# Patient Record
Sex: Male | Born: 1955 | Race: White | Hispanic: No | Smoking: Never smoker
Health system: Southern US, Community
[De-identification: ages and names within clinical notes are randomized; demographics above are authoritative.]

## PROBLEM LIST (undated history)

## (undated) DIAGNOSIS — F411 Generalized anxiety disorder: Secondary | ICD-10-CM

## (undated) DIAGNOSIS — G4733 Obstructive sleep apnea (adult) (pediatric): Secondary | ICD-10-CM

## (undated) DIAGNOSIS — E785 Hyperlipidemia, unspecified: Secondary | ICD-10-CM

## (undated) DIAGNOSIS — Z86010 Personal history of colonic polyps: Secondary | ICD-10-CM

## (undated) DIAGNOSIS — T884XXA Failed or difficult intubation, initial encounter: Secondary | ICD-10-CM

## (undated) DIAGNOSIS — N453 Epididymo-orchitis: Secondary | ICD-10-CM

## (undated) DIAGNOSIS — N508 Other specified disorders of male genital organs: Secondary | ICD-10-CM

## (undated) DIAGNOSIS — M25519 Pain in unspecified shoulder: Secondary | ICD-10-CM

## (undated) DIAGNOSIS — C801 Malignant (primary) neoplasm, unspecified: Secondary | ICD-10-CM

## (undated) DIAGNOSIS — K219 Gastro-esophageal reflux disease without esophagitis: Secondary | ICD-10-CM

## (undated) DIAGNOSIS — J309 Allergic rhinitis, unspecified: Secondary | ICD-10-CM

## (undated) DIAGNOSIS — R569 Unspecified convulsions: Secondary | ICD-10-CM

## (undated) DIAGNOSIS — G4730 Sleep apnea, unspecified: Secondary | ICD-10-CM

## (undated) HISTORY — DX: Other specified disorders of male genital organs: N50.8

## (undated) HISTORY — DX: Epididymo-orchitis: N45.3

## (undated) HISTORY — DX: Sleep apnea, unspecified: G47.30

## (undated) HISTORY — DX: Obstructive sleep apnea (adult) (pediatric): G47.33

## (undated) HISTORY — DX: Unspecified convulsions: R56.9

## (undated) HISTORY — DX: Generalized anxiety disorder: F41.1

## (undated) HISTORY — DX: Pain in unspecified shoulder: M25.519

## (undated) HISTORY — PX: TONSILLECTOMY: SUR1361

## (undated) HISTORY — DX: Allergic rhinitis, unspecified: J30.9

## (undated) HISTORY — DX: Personal history of colonic polyps: Z86.010

## (undated) HISTORY — PX: WISDOM TOOTH EXTRACTION: SHX21

## (undated) HISTORY — DX: Malignant (primary) neoplasm, unspecified: C80.1

## (undated) HISTORY — DX: Gastro-esophageal reflux disease without esophagitis: K21.9

## (undated) HISTORY — DX: Hyperlipidemia, unspecified: E78.5

## (undated) HISTORY — PX: OTHER SURGICAL HISTORY: SHX169

---
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
## 1977-01-14 HISTORY — PX: RENAL BIOPSY: SHX156

## 2000-01-15 DIAGNOSIS — G4730 Sleep apnea, unspecified: Secondary | ICD-10-CM

## 2000-01-15 HISTORY — DX: Sleep apnea, unspecified: G47.30

## 2004-06-12 ENCOUNTER — Ambulatory Visit: Payer: Self-pay | Admitting: Internal Medicine

## 2004-10-23 ENCOUNTER — Ambulatory Visit: Payer: Self-pay | Admitting: Internal Medicine

## 2005-01-24 ENCOUNTER — Ambulatory Visit: Payer: Self-pay | Admitting: Internal Medicine

## 2005-09-13 ENCOUNTER — Ambulatory Visit: Payer: Self-pay | Admitting: Internal Medicine

## 2006-01-14 HISTORY — PX: ARTHROSCOPIC REPAIR ACL: SUR80

## 2006-04-03 ENCOUNTER — Ambulatory Visit: Payer: Self-pay | Admitting: Internal Medicine

## 2006-09-13 ENCOUNTER — Encounter: Payer: Self-pay | Admitting: Internal Medicine

## 2006-09-13 DIAGNOSIS — G4733 Obstructive sleep apnea (adult) (pediatric): Secondary | ICD-10-CM

## 2006-09-13 DIAGNOSIS — E785 Hyperlipidemia, unspecified: Secondary | ICD-10-CM

## 2006-09-13 HISTORY — DX: Hyperlipidemia, unspecified: E78.5

## 2006-09-13 HISTORY — DX: Obstructive sleep apnea (adult) (pediatric): G47.33

## 2006-09-15 DIAGNOSIS — F411 Generalized anxiety disorder: Secondary | ICD-10-CM

## 2006-09-15 DIAGNOSIS — K219 Gastro-esophageal reflux disease without esophagitis: Secondary | ICD-10-CM | POA: Insufficient documentation

## 2006-09-15 HISTORY — DX: Generalized anxiety disorder: F41.1

## 2006-09-15 HISTORY — DX: Gastro-esophageal reflux disease without esophagitis: K21.9

## 2006-11-03 ENCOUNTER — Encounter: Payer: Self-pay | Admitting: Internal Medicine

## 2006-11-03 ENCOUNTER — Ambulatory Visit: Payer: Self-pay | Admitting: Internal Medicine

## 2006-11-03 DIAGNOSIS — J309 Allergic rhinitis, unspecified: Secondary | ICD-10-CM

## 2006-11-03 DIAGNOSIS — M25519 Pain in unspecified shoulder: Secondary | ICD-10-CM | POA: Insufficient documentation

## 2006-11-03 HISTORY — DX: Pain in unspecified shoulder: M25.519

## 2006-11-03 HISTORY — DX: Allergic rhinitis, unspecified: J30.9

## 2008-06-26 ENCOUNTER — Emergency Department (HOSPITAL_COMMUNITY): Admission: EM | Admit: 2008-06-26 | Discharge: 2008-06-26 | Payer: Self-pay | Admitting: Emergency Medicine

## 2009-01-14 HISTORY — PX: WISDOM TOOTH EXTRACTION: SHX21

## 2009-11-03 ENCOUNTER — Ambulatory Visit: Payer: Self-pay | Admitting: Internal Medicine

## 2009-11-03 DIAGNOSIS — N453 Epididymo-orchitis: Secondary | ICD-10-CM

## 2009-11-03 DIAGNOSIS — N508 Other specified disorders of male genital organs: Secondary | ICD-10-CM | POA: Insufficient documentation

## 2009-11-03 HISTORY — DX: Other specified disorders of male genital organs: N50.8

## 2009-11-03 HISTORY — DX: Epididymo-orchitis: N45.3

## 2009-11-03 LAB — CONVERTED CEMR LAB
Leukocytes, UA: NEGATIVE
Nitrite: NEGATIVE
Specific Gravity, Urine: 1.025 (ref 1.000–1.030)
Urine Glucose: NEGATIVE mg/dL
Urobilinogen, UA: 0.2 (ref 0.0–1.0)

## 2009-11-04 ENCOUNTER — Encounter: Payer: Self-pay | Admitting: Internal Medicine

## 2009-11-10 ENCOUNTER — Encounter: Admission: RE | Admit: 2009-11-10 | Discharge: 2009-11-10 | Payer: Self-pay | Admitting: Internal Medicine

## 2010-02-15 NOTE — Assessment & Plan Note (Signed)
Summary: DULL TESTICLE PAIN X 1 MTH--LAST OV:  08--STC   Vital Signs:  Patient profile:   55 year old male Height:      73 inches Weight:      196.50 pounds BMI:     26.02 O2 Sat:      96 % on Room air Temp:     98.6 degrees F oral Pulse rate:   87 / minute BP sitting:   100 / 70  (left arm) Cuff size:   regular  Vitals Entered By: Zella Ball Ewing CMA Duncan Dull) (November 03, 2009 2:45 PM)  O2 Flow:  Room air CC: Dull ache on testicle for 1 month/RE   CC:  Dull ache on testicle for 1 month/RE.  History of Present Illness: here with acute, currently unemployed since march, left a difficult medical sales position , took the summer off of looking for work to [employer] after a divorce finalized as well  - so signifciant stressors ongoing for last 2 yrs; just returned from [place] trip with girlfriend; while there mentions a day where they simply did not eat all day and after 14 hrs sightseeing they were in a food store when he had an episode of feelling "dazed" where he was cognizant and could speak , hear but quite slow but not really confused;  all resolved within 2-3 min of getting something sugary to eat; similar episode happened a few yrs ago after going all day without eating as well.  Pt denies CP, worsening sob, doe, wheezing, orthopnea, pnd, worsening LE edema, palps, dizziness or syncope  Pt denies other new neuro symptoms such as headache, facial or extremity weakness . No siezure type symtpoms as well.  Denies polydipsia or polyuria.  No fever, wt loss, night sweats, loss of appetite or other constitutional symptoms  Does have tender achiness to right testicle area for 1 wk, without othereGU symptoms such as pain on urination, freq, urgency.  Also noted hard lump behind the left testicle as well , not sure how long it has been there, but wondered about the signifcance.    Problems Prior to Update: 1)  Testicular Mass  (ICD-608.89) 2)  Epididymo-orchitis  (ICD-604.90) 3)  Preventive Health  Care  (ICD-V70.0) 4)  Shoulder Pain, Right  (ICD-719.41) 5)  Allergic Rhinitis  (ICD-477.9) 6)  Family History of Cad Male 1st Degree Relative <60  (ICD-V16.49) 7)  Gerd  (ICD-530.81) 8)  Anxiety  (ICD-300.00) 9)  Obstructive Sleep Apnea  (ICD-327.23) 10)  Hyperlipidemia  (ICD-272.4)  Medications Prior to Update: 1)  Prilosec Otc 20 Mg  Tbec (Omeprazole Magnesium) .Marland Kitchen.. 1 By Mouth Qd  Current Medications (verified): 1)  Prilosec Otc 20 Mg  Tbec (Omeprazole Magnesium) .Marland Kitchen.. 1 By Mouth Qd 2)  Aspir-Low 81 Mg Tbec (Aspirin) .Marland Kitchen.. 1 By Mouth Once Daily 3)  Doxycycline Hyclate 100 Mg Caps (Doxycycline Hyclate) .Marland Kitchen.. 1po Two Times A Day  Allergies (verified): No Known Drug Allergies  Past History:  Past Medical History: Last updated: 11/03/2006 Hyperlipidemia Obstructive Sleep Apnea Anxiety GERD Allergic rhinitis  Past Surgical History: Last updated: 09/13/2006 Inguinal herniorrhaphy  Social History: Last updated: 11/03/2009 Never Smoked Alcohol use-yes Divorced  Risk Factors: Smoking Status: never (11/03/2006)  Social History: Never Smoked Alcohol use-yes Divorced  Review of Systems       all otherwise negative per pt -    Physical Exam  General:  alert and well-developed.   Head:  normocephalic and atraumatic.   Eyes:  vision grossly intact, pupils  equal, and pupils round.   Ears:  R ear normal and L ear normal.   Nose:  no external deformity and no nasal discharge.   Mouth:  no gingival abnormalities and pharynx pink and moist.   Neck:  supple and no masses.   Lungs:  normal respiratory effort and normal breath sounds.   Heart:  normal rate and regular rhythm.   Genitalia:  mild tender/sweling noted to the right testicle/epididymous;  also noted post to the left testicle approx 10 mm hard mass, mobile, nontender Extremities:  no edema, no erythema    Impression & Recommendations:  Problem # 1:  EPIDIDYMO-ORCHITIS (ICD-604.90) treat as above, f/u any  worsening signs or symptoms . check urine cx Orders: T-Culture, Urine ([id number]) TLB-Udip w/ Micro ([id number])  Problem # 2:  TESTICULAR MASS (ICD-608.89) ? significance - to check testicle u/s, but by location I suspect possible cystic mass Orders: Radiology Referral (Radiology)  Problem # 3:  ANXIETY (ICD-300.00) stable overall by hx and exam, ok to continue meds/tx as is - situational, no further tx needed at this time  Complete Medication List: 1)  Prilosec Otc 20 Mg Tbec (Omeprazole magnesium) .Marland Kitchen.. 1 by mouth qd 2)  Aspir-low 81 Mg Tbec (Aspirin) .Marland Kitchen.. 1 by mouth once daily 3)  Doxycycline Hyclate 100 Mg Caps (Doxycycline hyclate) .Marland Kitchen.. 1po two times a day  Patient Instructions: 1)  Please take all new medications as prescribed  - the antibiotic 2)  Please go to the Lab in the basement for your urine tests today  3)  You will be contacted about the referral(s) to: ultrasound of testicles 4)  Continue all previous medications as before this visit  5)  Please schedule a follow-up appointment as needed. Prescriptions: DOXYCYCLINE HYCLATE 100 MG CAPS (DOXYCYCLINE HYCLATE) 1po two times a day  #20 x 0   Entered and Authorized by:   Corwin Levins MD   Signed by:   Corwin Levins MD on 11/03/2009   Method used:   Print then Give to Patient   RxID:   [id number]    Orders Added: 1)  T-Culture, Urine [[id number]] 2)  Radiology Referral [Radiology] 3)  TLB-Udip w/ Micro [81001-URINE] 4)  Est. Patient Level IV [84696]

## 2010-05-10 ENCOUNTER — Ambulatory Visit (INDEPENDENT_AMBULATORY_CARE_PROVIDER_SITE_OTHER)
Admission: RE | Admit: 2010-05-10 | Discharge: 2010-05-10 | Disposition: A | Discharge status: Home / Self Care | Payer: BC Managed Care – PPO | Source: Ambulatory Visit | Attending: Internal Medicine | Admitting: Internal Medicine

## 2010-05-10 ENCOUNTER — Ambulatory Visit (INDEPENDENT_AMBULATORY_CARE_PROVIDER_SITE_OTHER): Payer: BC Managed Care – PPO | Admitting: Internal Medicine

## 2010-05-10 ENCOUNTER — Encounter: Payer: Self-pay | Admitting: Internal Medicine

## 2010-05-10 VITALS — BP 104/72 | HR 82 | Temp 98.6°F | Ht 73.0 in | Wt 197.1 lb

## 2010-05-10 DIAGNOSIS — R053 Chronic cough: Secondary | ICD-10-CM

## 2010-05-10 DIAGNOSIS — J309 Allergic rhinitis, unspecified: Secondary | ICD-10-CM

## 2010-05-10 DIAGNOSIS — Z0000 Encounter for general adult medical examination without abnormal findings: Secondary | ICD-10-CM

## 2010-05-10 DIAGNOSIS — R05 Cough: Secondary | ICD-10-CM

## 2010-05-10 DIAGNOSIS — F411 Generalized anxiety disorder: Secondary | ICD-10-CM

## 2010-05-10 MED ORDER — HYDROCODONE-HOMATROPINE 5-1.5 MG/5ML PO SYRP: 5.0000 mL | ORAL_SOLUTION | Freq: Four times a day (QID) | ORAL | Status: AC | PRN

## 2010-05-10 NOTE — Assessment & Plan Note (Addendum)
I suspect simple post bronchitic persistent that will eventually resolve, but with CP and fatigue will check cxr, and cough med prn ,  to f/u any worsening symptoms or concerns

## 2010-05-10 NOTE — Patient Instructions (Signed)
Take all new medications as prescribed Continue all other medications as before Please go to XRAY in the Basement for the x-ray test Please call the number on the [facility] Card (the [employer] System) for results of testing in 2-3 days

## 2010-05-11 NOTE — Progress Notes (Signed)
Quick Note:  Voice message left on [employer] system - lab is negative, normal or otherwise stable, pt to continue same tx ______ 

## 2010-05-12 ENCOUNTER — Encounter: Payer: Self-pay | Admitting: Internal Medicine

## 2010-05-12 DIAGNOSIS — Z0000 Encounter for general adult medical examination without abnormal findings: Secondary | ICD-10-CM | POA: Insufficient documentation

## 2010-05-12 DIAGNOSIS — Z0001 Encounter for general adult medical examination with abnormal findings: Secondary | ICD-10-CM | POA: Insufficient documentation

## 2010-05-12 NOTE — Assessment & Plan Note (Signed)
stable overall by hx and exam, and pt to continue medical treatment as before  to f/u any worsening symptoms or concerns

## 2010-05-12 NOTE — Assessment & Plan Note (Signed)
Mild, for OTC allegra prn,  to f/u any worsening symptoms or concerns

## 2010-05-12 NOTE — Progress Notes (Signed)
Subjective:    Patient ID: Bradley Roberts, male    DOB: 05-Sep-1955, 55 y.o.   MRN: [id number]  HPI  Here to f/u, after initally being seen at urgent care approx 3.5 wks ago;  S/p 5 day antibx (cant remember name) for URI, later returned with wheezing and cough tx with steroid shot, and prepack (finished apr 23);  Here today with persistent cough, nonprod, some "rattling" on expirations and mid low SSCP sharp only to turn over in bed at night;  Pt denies other chest pain, increased sob or doe, wheezing, orthopnea, PND, increased LE swelling, palpitations, dizziness or syncope.No CXR done since start of illness.  Delsym not working for cough.  Not prescribed inhaler and did not feel he needed it.  Nonsmoker.   Pt denies fever, wt loss, night sweats, loss of appetite, or other constitutional symptoms, except with current illness and no fever, chills today.  Has significant persistent fatigue and takes nap during the day.   Pt denies new neurological symptoms such as new headache, or facial or extremity weakness or numbness   Pt denies polydipsia, polyuria.  Denies worsening depressive symptoms, suicidal ideation, or panic.   Does have several months ongoing nasal allergy symptoms with clear congestion, itch and sneeze, without fever, pain, ST.  Past Medical History  Diagnosis Date  . HYPERLIPIDEMIA 09/13/2006  . ANXIETY 09/15/2006  . OBSTRUCTIVE SLEEP APNEA 09/13/2006  . ALLERGIC RHINITIS 11/03/2006  . GERD 09/15/2006  . 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 TESTICULAR MASS 11/03/2009  . SHOULDER PAIN, RIGHT 11/03/2006   Past Surgical History  Procedure Date  . Inguinal herniorrhapy     reports that he has never smoked. He does not have any smokeless tobacco history on file. He reports that he drinks alcohol. His drug history not on file. family history includes Cancer in his other and Coronary artery disease (age of onset:60) in his mother. No Known Allergies Current Outpatient Prescriptions on File Prior  to Visit  Medication Sig Dispense Refill  . aspirin 81 MG EC tablet Take 81 mg by mouth daily.        Marland Kitchen omeprazole (PRILOSEC) 20 MG capsule Take 20 mg by mouth daily.        Marland Kitchen doxycycline (VIBRAMYCIN) 100 MG capsule Take 100 mg by mouth 2 (two) times daily.         Review of Systems Review of Systems  Constitutional: Negative for diaphoresis and unexpected weight change.  HENT: Negative for drooling and tinnitus.   Eyes: Negative for photophobia and visual disturbance.  Respiratory: Negative for choking and stridor.   Gastrointestinal: Negative for vomiting and blood in stool.  Genitourinary: Negative for hematuria and decreased urine volume.  Musculoskeletal: Negative for gait problem.  Skin: Negative for color change and wound.  Neurological: Negative for tremors and numbness.  Psychiatric/Behavioral: Negative for decreased concentration. The patient is not hyperactive.       Objective:   Physical Exam BP 104/72  Pulse 82  Temp(Src) 98.6 F (37 C) (Oral)  Ht 6\' 1"  (1.854 m)  Wt 197 lb 2 oz (89.415 kg)  BMI 26.01 kg/m2  SpO2 96% Physical Exam  VS noted Constitutional: Pt appears well-developed and well-nourished.  HENT: Head: Normocephalic.  Right Ear: External ear normal.  Left Ear: External ear normal.  Bilat tm's mild erythema.  Sinus nontender.  Pharynx mild erythema Eyes: Conjunctivae and EOM are normal. Pupils are equal, round, and reactive to light.  Neck: Normal range of motion. Neck supple.  Cardiovascular: Normal rate and regular rhythm.   Pulmonary/Chest: Effort normal and breath sounds normal.  Neurological: Pt is alert. No cranial nerve deficit.  Skin: Skin is warm. No erythema.  Psychiatric: Pt behavior is normal. Thought content normal.  1+ nervous        Assessment & Plan:

## 2012-05-12 ENCOUNTER — Ambulatory Visit (INDEPENDENT_AMBULATORY_CARE_PROVIDER_SITE_OTHER): Payer: PRIVATE HEALTH INSURANCE | Admitting: Internal Medicine

## 2012-05-12 ENCOUNTER — Encounter: Payer: Self-pay | Admitting: Internal Medicine

## 2012-05-12 VITALS — BP 114/80 | HR 78 | Temp 98.3°F | Ht 73.0 in | Wt 187.1 lb

## 2012-05-12 DIAGNOSIS — K219 Gastro-esophageal reflux disease without esophagitis: Secondary | ICD-10-CM

## 2012-05-12 DIAGNOSIS — H029 Unspecified disorder of eyelid: Secondary | ICD-10-CM | POA: Insufficient documentation

## 2012-05-12 DIAGNOSIS — K4090 Unilateral inguinal hernia, without obstruction or gangrene, not specified as recurrent: Secondary | ICD-10-CM

## 2012-05-12 NOTE — Assessment & Plan Note (Signed)
Reducible, for gen surgury referral

## 2012-05-12 NOTE — Assessment & Plan Note (Signed)
Benign appaerance, pt will call with name of opthomologist he would want referral

## 2012-05-12 NOTE — Patient Instructions (Addendum)
Please continue all other medications as before - the prilosec, and aspirin You will be contacted regarding the referral for: general surgury Please forward any recent lab work from your most recent insurance evaluation Please call or message with the name of opthomology Thank you for enrolling in MyChart. Please follow the instructions below to securely access your online medical record. MyChart allows you to send messages to your doctor, view your test results, renew your prescriptions, schedule appointments, and more. To Log into My Chart online, please go by [employer] or [employer] to [email or web address], or download the MyChart App from the [employer] of [employer] .  Your Username is: [email or web address] (pass natedad!) Please send a practice Message on Mychart later today.

## 2012-05-12 NOTE — Progress Notes (Signed)
  Subjective:    Patient ID: Bradley Roberts, male    DOB: 1955/08/03, 57 y.o.   MRN: [id number]  HPI  Here to f/u, employed as medical device salesman/optho related, c/o 4-5 yr lesion to left upper eyelid ? Increased in size, tan/not black, no pain or other visual symptoms.  Also has been more excercising lately, lost 10 lbs and now with seemingly increased Right inguinal hernia like swelling without increased pain, n/v or bowel change.   Pt denies fever, wt loss, night sweats, loss of appetite, or other constitutional symptoms. Denies worsening reflux, abd pain, dysphagia, n/v, bowel change or blood, prefers OTC med. Past Medical History  Diagnosis Date  . HYPERLIPIDEMIA 09/13/2006  . ANXIETY 09/15/2006  . OBSTRUCTIVE SLEEP APNEA 09/13/2006  . ALLERGIC RHINITIS 11/03/2006  . GERD 09/15/2006  . 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 TESTICULAR MASS 11/03/2009  . SHOULDER PAIN, RIGHT 11/03/2006   Past Surgical History  Procedure Laterality Date  . Inguinal herniorrhapy      reports that he has never smoked. He does not have any smokeless tobacco history on file. He reports that  drinks alcohol. His drug history is not on file. family history includes Cancer in his other and Coronary artery disease (age of onset: 73) in his mother. No Known Allergies Current Outpatient Prescriptions on File Prior to Visit  Medication Sig Dispense Refill  . aspirin 81 MG EC tablet Take 81 mg by mouth daily.        Marland Kitchen omeprazole (PRILOSEC) 20 MG capsule Take 20 mg by mouth daily.         No current facility-administered medications on file prior to visit.   Review of Systems All otherwise neg per pt     Objective:   Physical Exam BP 114/80  Pulse 78  Temp(Src) 98.3 F (36.8 C) (Oral)  Ht 6\' 1"  (1.854 m)  Wt 187 lb 2 oz (84.879 kg)  BMI 24.69 kg/m2  SpO2 97% VS noted,  Constitutional: Pt appears well-developed and well-nourished.  HENT: Head: NCAT.  Right Ear: External ear normal.  Left Ear: External ear  normal.  Eyes: Conjunctivae and EOM are normal. Pupils are equal, round, and reactive to light.  Neck: Normal range of motion. Neck supple.  Cardiovascular: Normal rate and regular rhythm.   Pulmonary/Chest: Effort normal and breath sounds normal.  Abd:  Soft, NT, non-distended, + BS Neurological: Pt is alert. Not confused  Skin: Skin is warm. No erythema. left upper lateral eyelid with 5 mm flat but slightly raised tan lesion with regular edges Right inguinal region with moderate sized hernia reducible, mild tender Psychiatric: Pt behavior is normal. Thought content normal.     Assessment & Plan:

## 2012-05-12 NOTE — Assessment & Plan Note (Signed)
stable overall by history and exam, and pt to continue medical treatment as before,  to f/u any worsening symptoms or concerns 

## 2012-06-03 ENCOUNTER — Telehealth (INDEPENDENT_AMBULATORY_CARE_PROVIDER_SITE_OTHER): Payer: Self-pay

## 2012-06-03 ENCOUNTER — Ambulatory Visit (INDEPENDENT_AMBULATORY_CARE_PROVIDER_SITE_OTHER): Payer: PRIVATE HEALTH INSURANCE | Admitting: Surgery

## 2012-06-03 ENCOUNTER — Encounter (INDEPENDENT_AMBULATORY_CARE_PROVIDER_SITE_OTHER): Payer: Self-pay | Admitting: Surgery

## 2012-06-03 VITALS — BP 132/70 | HR 56 | Temp 97.8°F | Resp 16 | Ht 73.0 in | Wt 188.0 lb

## 2012-06-03 DIAGNOSIS — K4090 Unilateral inguinal hernia, without obstruction or gangrene, not specified as recurrent: Secondary | ICD-10-CM

## 2012-06-03 NOTE — Patient Instructions (Signed)
[facility]  HERNIA REPAIR POST OP INSTRUCTIONS  Always review your discharge instruction sheet given to you by the facility where your surgery was performed.  1. A  prescription for pain medication may be given to you upon discharge.  Take your pain medication as prescribed.  If narcotic pain medicine is not needed, then you may take acetaminophen (Tylenol) or ibuprofen (Advil) as needed.  2. Take your usually prescribed medications unless otherwise directed.  3. If you need a refill on your pain medication, please contact your pharmacy.  They will contact our office to request authorization. Prescriptions will not be filled after 5 pm daily or on weekends.  4. You should follow a light diet the first 24 hours after arrival home, such as soup and crackers or toast.  Be sure to include plenty of fluids daily.  Resume your normal diet the day after surgery.  5. Most patients will experience some swelling and bruising around the surgical site.  Ice packs and reclining will help.  Swelling and bruising can take several days to resolve.   6. It is common to experience some constipation if taking pain medication after surgery.  Increasing fluid intake and taking a stool softener (such as Colace) will usually help or prevent this problem from occurring.  A mild laxative (Milk of Magnesia or Miralax) should be taken according to package directions if there are no bowel movements after 48 hours.  7. Unless discharge instructions indicate otherwise, you may remove your bandages 24-48 hours after surgery, and you may shower at that time.  You may have steri-strips (small skin tapes) in place directly over the incision.  These strips should be left on the skin for 7-10 days.  If your surgeon used skin glue on the incision, you may shower in 24 hours.  The glue will flake off over the next 2-3 weeks.  Any sutures or staples will be removed at the office during your follow-up  visit.  8. ACTIVITIES:  You may resume regular (light) daily activities beginning the next day-such as daily self-care, walking, climbing stairs-gradually increasing activities as tolerated.  You may have sexual intercourse when it is comfortable.  Refrain from any heavy lifting or straining until approved by your doctor.  You may drive when you are no longer taking prescription pain medication, you can comfortably wear a seatbelt, and you can safely maneuver your car and apply brakes.  9. You should see your doctor in the office for a follow-up appointment approximately 2-3 weeks after your surgery.  Make sure that you call for this appointment within a day or two after you arrive home to insure a convenient appointment time. 10.   WHEN TO CALL YOUR DOCTOR: 1. Fever greater than 101.0 2. Inability to urinate 3. Persistent nausea and/or vomiting 4. Extreme swelling or bruising 5. Continued bleeding from incision 6. Increased pain, redness, or drainage from the incision  The clinic staff is available to answer your questions during regular business hours.  Please don't hesitate to call and ask to speak to one of the nurses for clinical concerns.  If you have a medical emergency, go to the nearest emergency room or call 911.  A surgeon from [facility] is always on call for the hospital.   [facility] [address], [address], [place],   [address]  [phone] ? [phone] ? FAX [phone]  [email or web address]   

## 2012-06-03 NOTE — Progress Notes (Signed)
General Surgery - [facility]  Chief Complaint  Patient presents with  . New Evaluation    eval RIH - referral from Dr. James John, [facility]    HISTORY: Patient is a 57-year-old white male referred by his primary care physician for newly diagnosed right inguinal hernia. Over the past few months the patient has noted a progressive bulge on the right side. This has always been reducible. He has had no signs or symptoms of intestinal obstruction. Patient denies any significant pain.  Patient did undergo repair of right inguinal hernia as a 7-year-old child in [place]. Further details are not available.  Past Medical History  Diagnosis Date  . HYPERLIPIDEMIA 09/13/2006  . ANXIETY 09/15/2006  . OBSTRUCTIVE SLEEP APNEA 09/13/2006  . ALLERGIC RHINITIS 11/03/2006  . GERD 09/15/2006  . 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 TESTICULAR MASS 11/03/2009  . SHOULDER PAIN, RIGHT 11/03/2006     Current Outpatient Prescriptions  Medication Sig Dispense Refill  . aspirin 81 MG EC tablet Take 81 mg by mouth daily.        . omeprazole (PRILOSEC) 20 MG capsule Take 20 mg by mouth daily.         No current facility-administered medications for this visit.     No Known Allergies   Family History  Problem Relation Age of Onset  . Heart attack Other   . Cancer Father     lung  . Heart attack Father   . Heart attack Maternal Grandfather   . Diabetes Paternal Grandmother      History   Social History  . Marital Status: Divorced    Spouse Name: N/A    Number of Children: N/A  . Years of Education: N/A   Social History Main Topics  . Smoking status: Never Smoker   . Smokeless tobacco: Never Used  . Alcohol Use: Yes  . Drug Use: None  . Sexually Active: None   Other Topics Concern  . None   Social History Narrative  . None     REVIEW OF SYSTEMS - PERTINENT POSITIVES ONLY: Denies any signs or symptoms of obstruction. Always reducible, no history of  incarceration. No significant pain.  EXAM: Filed Vitals:   06/03/12 0901  BP: 132/70  Pulse: 56  Temp: 97.8 F (36.6 C)  Resp: 16    HEENT: normocephalic; pupils equal and reactive; sclerae clear; dentition good; mucous membranes moist NECK:  symmetric on extension; no palpable anterior or posterior cervical lymphadenopathy; no supraclavicular masses; no tenderness CHEST: clear to auscultation bilaterally without rales, rhonchi, or wheezes CARDIAC: regular rate and rhythm without significant murmur; peripheral pulses are full ABDOMEN: soft without distension; bowel sounds present; no mass; no hepatosplenomegaly; reducible umbilical hernia with small fascial defect less than 1 cm in diameter GU:  Normal male genitalia without mass or lesion; obvious bulge in right groin. Well-healed surgical incisions. Palpation reveals likely direct inguinal hernia which is soft and reducible. It spontaneously prolapses. On the left side there is no palpable hernia with cough or Valsalva. There is a well-healed small surgical incision on the left groin. EXT:  non-tender without edema; no deformity NEURO: no gross focal deficits; no sign of tremor   LABORATORY RESULTS: See [facility] ([facility]L-Epic) for most recent results   RADIOLOGY RESULTS: See [facility] (CHL-Epic) for most recent results   IMPRESSION: #1 right inguinal hernia, recurrent, reducible #2 umbilical hernia, reducible, asymptomatic  PLAN: The patient and I discussed hernia repair at length. I   demonstrated various types of mesh which may be used in his repair. We discussed the procedure at length. We discussed alternative procedures including laparoscopic surgery. I have recommended open right inguinal hernia repair with mesh. We discussed the risk of recurrence. Patient understands and wishes to proceed.  Patient and I discussed umbilical hernia repair. At this point he is asymptomatic and he has noted no enlargement in the  size of the hernia over many years. He does not desire concurrent repair.  The risks and benefits of the procedure have been discussed at length with the patient.  The patient understands the proposed procedure, potential alternative treatments, and the course of recovery to be expected.  All of the patient's questions have been answered at this time.  The patient wishes to proceed with surgery.  Kelaiah Escalona M. Mandel Seiden, MD, FACS General & Endocrine Surgery [facility]   Visit Diagnoses: 1. Right inguinal hernia     Primary Care Physician: James John, MD   

## 2012-06-03 NOTE — Telephone Encounter (Signed)
Pt advised to d/c aspirin 5 days pre op per Dr Ardine Eng request.

## 2012-06-04 ENCOUNTER — Telehealth (INDEPENDENT_AMBULATORY_CARE_PROVIDER_SITE_OTHER): Payer: Self-pay

## 2012-06-04 NOTE — Telephone Encounter (Signed)
Pt advised of PO appt.

## 2012-06-22 ENCOUNTER — Encounter (HOSPITAL_COMMUNITY): Payer: Self-pay | Admitting: *Deleted

## 2012-06-22 NOTE — Progress Notes (Signed)
No cardiac or resp problems-did a sleep study about 2002-did have mild-mod-did not get cpap-does snore some-pt down about 18lb-no post op problems

## 2012-06-26 ENCOUNTER — Encounter (HOSPITAL_BASED_OUTPATIENT_CLINIC_OR_DEPARTMENT_OTHER): Payer: Self-pay | Admitting: *Deleted

## 2012-06-26 ENCOUNTER — Encounter (HOSPITAL_BASED_OUTPATIENT_CLINIC_OR_DEPARTMENT_OTHER)
Admission: RE | Disposition: A | Discharge status: Home / Self Care | Payer: Self-pay | Source: Ambulatory Visit | Attending: Surgery

## 2012-06-26 ENCOUNTER — Ambulatory Visit (HOSPITAL_BASED_OUTPATIENT_CLINIC_OR_DEPARTMENT_OTHER): Payer: No Typology Code available for payment source | Admitting: Anesthesiology

## 2012-06-26 ENCOUNTER — Encounter (HOSPITAL_BASED_OUTPATIENT_CLINIC_OR_DEPARTMENT_OTHER): Payer: Self-pay | Admitting: Anesthesiology

## 2012-06-26 ENCOUNTER — Ambulatory Visit (HOSPITAL_COMMUNITY)
Admission: RE | Admit: 2012-06-26 | Discharge: 2012-06-26 | Disposition: A | Discharge status: Home / Self Care | Payer: No Typology Code available for payment source | Source: Ambulatory Visit | Attending: Surgery | Admitting: Surgery

## 2012-06-26 DIAGNOSIS — K4091 Unilateral inguinal hernia, without obstruction or gangrene, recurrent: Secondary | ICD-10-CM | POA: Insufficient documentation

## 2012-06-26 DIAGNOSIS — F411 Generalized anxiety disorder: Secondary | ICD-10-CM | POA: Insufficient documentation

## 2012-06-26 DIAGNOSIS — K4090 Unilateral inguinal hernia, without obstruction or gangrene, not specified as recurrent: Secondary | ICD-10-CM

## 2012-06-26 DIAGNOSIS — Z7982 Long term (current) use of aspirin: Secondary | ICD-10-CM | POA: Insufficient documentation

## 2012-06-26 DIAGNOSIS — K219 Gastro-esophageal reflux disease without esophagitis: Secondary | ICD-10-CM | POA: Insufficient documentation

## 2012-06-26 DIAGNOSIS — E785 Hyperlipidemia, unspecified: Secondary | ICD-10-CM | POA: Insufficient documentation

## 2012-06-26 DIAGNOSIS — K429 Umbilical hernia without obstruction or gangrene: Secondary | ICD-10-CM | POA: Insufficient documentation

## 2012-06-26 DIAGNOSIS — Z79899 Other long term (current) drug therapy: Secondary | ICD-10-CM | POA: Insufficient documentation

## 2012-06-26 DIAGNOSIS — G4733 Obstructive sleep apnea (adult) (pediatric): Secondary | ICD-10-CM | POA: Insufficient documentation

## 2012-06-26 DIAGNOSIS — J309 Allergic rhinitis, unspecified: Secondary | ICD-10-CM | POA: Insufficient documentation

## 2012-06-26 HISTORY — PX: INGUINAL HERNIA REPAIR: SHX194

## 2012-06-26 HISTORY — PX: INSERTION OF MESH: SHX5868

## 2012-06-26 LAB — POCT HEMOGLOBIN-HEMACUE: Hemoglobin: 15.7 g/dL (ref 13.0–17.0)

## 2012-06-26 SURGERY — REPAIR, HERNIA, INGUINAL, ADULT
Anesthesia: General | Laterality: Right

## 2012-06-26 SURGERY — REPAIR, HERNIA, INGUINAL, ADULT
Anesthesia: General | Site: Groin | Laterality: Right | Wound class: Clean

## 2012-06-26 MED ORDER — CEFAZOLIN SODIUM-DEXTROSE 2-3 GM-% IV SOLR
2.0000 g | INTRAVENOUS | Status: AC
  Administered 2012-06-26: 2 g via INTRAVENOUS

## 2012-06-26 MED ORDER — ONDANSETRON HCL 4 MG/2ML IJ SOLN
INTRAMUSCULAR | Status: DC | PRN
  Administered 2012-06-26: 4 mg via INTRAVENOUS

## 2012-06-26 MED ORDER — BUPIVACAINE HCL (PF) 0.5 % IJ SOLN
INTRAMUSCULAR | Status: DC | PRN
  Administered 2012-06-26: 20 mL

## 2012-06-26 MED ORDER — DEXAMETHASONE SODIUM PHOSPHATE 4 MG/ML IJ SOLN
INTRAMUSCULAR | Status: DC | PRN
  Administered 2012-06-26: 8 mg via INTRAVENOUS

## 2012-06-26 MED ORDER — LACTATED RINGERS IV SOLN
INTRAVENOUS | Status: DC
  Administered 2012-06-26 (×2): via INTRAVENOUS

## 2012-06-26 MED ORDER — MIDAZOLAM HCL 5 MG/5ML IJ SOLN
INTRAMUSCULAR | Status: DC | PRN
  Administered 2012-06-26: 2 mg via INTRAVENOUS

## 2012-06-26 MED ORDER — PROPOFOL 10 MG/ML IV BOLUS
INTRAVENOUS | Status: DC | PRN
  Administered 2012-06-26: 200 mg via INTRAVENOUS

## 2012-06-26 MED ORDER — NEOSTIGMINE METHYLSULFATE 1 MG/ML IJ SOLN
INTRAMUSCULAR | Status: DC | PRN
  Administered 2012-06-26: 3 mg via INTRAVENOUS

## 2012-06-26 MED ORDER — MIDAZOLAM HCL 2 MG/2ML IJ SOLN
1.0000 mg | INTRAMUSCULAR | Status: DC | PRN
  Administered 2012-06-26: 2 mg via INTRAVENOUS

## 2012-06-26 MED ORDER — FENTANYL CITRATE 0.05 MG/ML IJ SOLN
50.0000 ug | INTRAMUSCULAR | Status: DC | PRN
  Administered 2012-06-26: 100 ug via INTRAVENOUS

## 2012-06-26 MED ORDER — ROCURONIUM BROMIDE 100 MG/10ML IV SOLN
INTRAVENOUS | Status: DC | PRN
  Administered 2012-06-26: 50 mg via INTRAVENOUS

## 2012-06-26 MED ORDER — FENTANYL CITRATE 0.05 MG/ML IJ SOLN
INTRAMUSCULAR | Status: DC | PRN
  Administered 2012-06-26: 100 ug via INTRAVENOUS

## 2012-06-26 MED ORDER — GLYCOPYRROLATE 0.2 MG/ML IJ SOLN
INTRAMUSCULAR | Status: DC | PRN
  Administered 2012-06-26: 0.4 mg via INTRAVENOUS

## 2012-06-26 MED ORDER — HYDROCODONE-ACETAMINOPHEN 5-325 MG PO TABS: 1.0000 | ORAL_TABLET | ORAL | Status: DC | PRN

## 2012-06-26 MED ORDER — OXYCODONE HCL 5 MG/5ML PO SOLN: 5.0000 mg | Freq: Once | ORAL | Status: DC | PRN

## 2012-06-26 MED ORDER — OXYCODONE HCL 5 MG PO TABS: 5.0000 mg | ORAL_TABLET | Freq: Once | ORAL | Status: DC | PRN

## 2012-06-26 MED ORDER — LIDOCAINE HCL (CARDIAC) 20 MG/ML IV SOLN
INTRAVENOUS | Status: DC | PRN
  Administered 2012-06-26: 75 mg via INTRAVENOUS

## 2012-06-26 MED ORDER — BUPIVACAINE-EPINEPHRINE PF 0.5-1:200000 % IJ SOLN
INTRAMUSCULAR | Status: DC | PRN
  Administered 2012-06-26: 35 mL

## 2012-06-26 MED ORDER — HYDROMORPHONE HCL PF 1 MG/ML IJ SOLN: 0.2500 mg | INTRAMUSCULAR | Status: DC | PRN

## 2012-06-26 SURGICAL SUPPLY — 43 items
BENZOIN TINCTURE PRP APPL 2/3 (GAUZE/BANDAGES/DRESSINGS) ×2 IMPLANT
BLADE SURG 15 STRL LF DISP TIS (BLADE) ×1 IMPLANT
BLADE SURG 15 STRL SS (BLADE) ×1
BLADE SURG ROTATE 9660 (MISCELLANEOUS) ×2 IMPLANT
CANISTER SUCTION 1200CC (MISCELLANEOUS) IMPLANT
CHLORAPREP W/TINT 26ML (MISCELLANEOUS) ×2 IMPLANT
CLEANER CAUTERY TIP 5X5 PAD (MISCELLANEOUS) ×1 IMPLANT
CLOTH BEACON ORANGE TIMEOUT ST (SAFETY) ×2 IMPLANT
COVER MAYO STAND STRL (DRAPES) ×2 IMPLANT
COVER TABLE BACK 60X90 (DRAPES) ×2 IMPLANT
DECANTER SPIKE VIAL GLASS SM (MISCELLANEOUS) IMPLANT
DRAIN PENROSE 1/2X12 LTX STRL (WOUND CARE) ×2 IMPLANT
DRAPE PED LAPAROTOMY (DRAPES) ×2 IMPLANT
DRAPE UTILITY XL STRL (DRAPES) ×2 IMPLANT
ELECT REM PT RETURN 9FT ADLT (ELECTROSURGICAL) ×2
ELECTRODE REM PT RTRN 9FT ADLT (ELECTROSURGICAL) ×1 IMPLANT
GAUZE SPONGE 4X4 12PLY STRL LF (GAUZE/BANDAGES/DRESSINGS) IMPLANT
GLOVE BIO SURGEON STRL SZ 6.5 (GLOVE) ×2 IMPLANT
GLOVE BIOGEL PI IND STRL 7.0 (GLOVE) ×1 IMPLANT
GLOVE BIOGEL PI INDICATOR 7.0 (GLOVE) ×1
GLOVE EXAM NITRILE MD LF STRL (GLOVE) ×2 IMPLANT
GLOVE SURG ORTHO 8.0 STRL STRW (GLOVE) ×2 IMPLANT
GOWN PREVENTION PLUS XLARGE (GOWN DISPOSABLE) ×2 IMPLANT
GOWN PREVENTION PLUS XXLARGE (GOWN DISPOSABLE) ×2 IMPLANT
MESH ULTRAPRO 3X6 7.6X15CM (Mesh General) ×2 IMPLANT
NEEDLE HYPO 25X1 1.5 SAFETY (NEEDLE) ×2 IMPLANT
NS IRRIG 1000ML POUR BTL (IV SOLUTION) ×2 IMPLANT
PACK BASIN DAY SURGERY FS (CUSTOM PROCEDURE TRAY) ×2 IMPLANT
PAD CLEANER CAUTERY TIP 5X5 (MISCELLANEOUS) ×1
PENCIL BUTTON HOLSTER BLD 10FT (ELECTRODE) ×2 IMPLANT
SLEEVE SCD COMPRESS KNEE MED (MISCELLANEOUS) ×2 IMPLANT
STRIP CLOSURE SKIN 1/2X4 (GAUZE/BANDAGES/DRESSINGS) ×2 IMPLANT
SUT MNCRL AB 4-0 PS2 18 (SUTURE) ×2 IMPLANT
SUT NOVA NAB GS-22 2 0 T19 (SUTURE) ×4 IMPLANT
SUT SILK 2 0 SH (SUTURE) ×2 IMPLANT
SUT SILK 2 0 TIES 17X18 (SUTURE)
SUT SILK 2-0 18XBRD TIE BLK (SUTURE) IMPLANT
SUT VICRYL 3-0 CR8 SH (SUTURE) ×2 IMPLANT
SYR CONTROL 10ML LL (SYRINGE) ×2 IMPLANT
TOWEL OR 17X24 6PK STRL BLUE (TOWEL DISPOSABLE) ×2 IMPLANT
TOWEL OR NON WOVEN STRL DISP B (DISPOSABLE) IMPLANT
TUBE CONNECTING 20X1/4 (TUBING) IMPLANT
YANKAUER SUCT BULB TIP NO VENT (SUCTIONS) IMPLANT

## 2012-06-26 NOTE — Anesthesia Preprocedure Evaluation (Signed)
Anesthesia Evaluation  Patient identified by MRN, date of birth, ID band Patient awake    Reviewed: Allergy & Precautions, H&P , NPO status , Patient's Chart, lab work & pertinent test results  Airway Mallampati: I TM Distance: >3 FB Neck ROM: Full    Dental   Pulmonary sleep apnea ,  breath sounds clear to auscultation        Cardiovascular Rhythm:Regular Rate:Normal     Neuro/Psych    GI/Hepatic GERD-  ,  Endo/Other    Renal/GU      Musculoskeletal   Abdominal   Peds  Hematology   Anesthesia Other Findings   Reproductive/Obstetrics                           Anesthesia Physical Anesthesia Plan  ASA: II  Anesthesia Plan: General   Post-op Pain Management:    Induction: Intravenous  Airway Management Planned: Oral ETT  Additional Equipment:   Intra-op Plan:   Post-operative Plan: Extubation in OR  Informed Consent: I have reviewed the patients History and Physical, chart, labs and discussed the procedure including the risks, benefits and alternatives for the proposed anesthesia with the patient or authorized representative who has indicated his/her understanding and acceptance.     Plan Discussed with: CRNA and Surgeon  Anesthesia Plan Comments:         Anesthesia Quick Evaluation

## 2012-06-26 NOTE — H&P (View-Only) (Signed)
General Surgery [facility]  Chief Complaint  Patient presents with  . New Evaluation    eval RIH - referral from Dr. Oliver Barre, [facility]    HISTORY: Patient is a 57 year old white male referred by his primary care physician for newly diagnosed right inguinal hernia. Over the past few months the patient has noted a progressive bulge on the right side. This has always been reducible. He has had no signs or symptoms of intestinal obstruction. Patient denies any significant pain.  Patient did undergo repair of right inguinal hernia as a 70-year-old child in [place]. Further details are not available.  Past Medical History  Diagnosis Date  . HYPERLIPIDEMIA 09/13/2006  . ANXIETY 09/15/2006  . OBSTRUCTIVE SLEEP APNEA 09/13/2006  . ALLERGIC RHINITIS 11/03/2006  . GERD 09/15/2006  . 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 TESTICULAR MASS 11/03/2009  . SHOULDER PAIN, RIGHT 11/03/2006     Current Outpatient Prescriptions  Medication Sig Dispense Refill  . aspirin 81 MG EC tablet Take 81 mg by mouth daily.        Marland Kitchen omeprazole (PRILOSEC) 20 MG capsule Take 20 mg by mouth daily.         No current facility-administered medications for this visit.     No Known Allergies   Family History  Problem Relation Age of Onset  . Heart attack Other   . Cancer Father     lung  . Heart attack Father   . Heart attack Maternal Grandfather   . Diabetes Paternal Grandmother      History   Social History  . Marital Status: Divorced    Spouse Name: N/A    Number of Children: N/A  . Years of Education: N/A   Social History Main Topics  . Smoking status: Never Smoker   . Smokeless tobacco: Never Used  . Alcohol Use: Yes  . Drug Use: None  . Sexually Active: None   Other Topics Concern  . None   Social History Narrative  . None     REVIEW OF SYSTEMS - PERTINENT POSITIVES ONLY: Denies any signs or symptoms of obstruction. Always reducible, no history of  incarceration. No significant pain.  EXAM: Filed Vitals:   06/03/12 0901  BP: 132/70  Pulse: 56  Temp: 97.8 F (36.6 C)  Resp: 16    HEENT: normocephalic; pupils equal and reactive; sclerae clear; dentition good; mucous membranes moist NECK:  symmetric on extension; no palpable anterior or posterior cervical lymphadenopathy; no supraclavicular masses; no tenderness CHEST: clear to auscultation bilaterally without rales, rhonchi, or wheezes CARDIAC: regular rate and rhythm without significant murmur; peripheral pulses are full ABDOMEN: soft without distension; bowel sounds present; no mass; no hepatosplenomegaly; reducible umbilical hernia with small fascial defect less than 1 cm in diameter GU:  Normal male genitalia without mass or lesion; obvious bulge in right groin. Well-healed surgical incisions. Palpation reveals likely direct inguinal hernia which is soft and reducible. It spontaneously prolapses. On the left side there is no palpable hernia with cough or Valsalva. There is a well-healed small surgical incision on the left groin. EXT:  non-tender without edema; no deformity NEURO: no gross focal deficits; no sign of tremor   LABORATORY RESULTS: See [facility] (CHL-Epic) for most recent results   RADIOLOGY RESULTS: See [facility] (CHL-Epic) for most recent results   IMPRESSION: #1 right inguinal hernia, recurrent, reducible #2 umbilical hernia, reducible, asymptomatic  PLAN: The patient and I discussed hernia repair at length. I  demonstrated various types of mesh which may be used in his repair. We discussed the procedure at length. We discussed alternative procedures including laparoscopic surgery. I have recommended open right inguinal hernia repair with mesh. We discussed the risk of recurrence. Patient understands and wishes to proceed.  Patient and I discussed umbilical hernia repair. At this point he is asymptomatic and he has noted no enlargement in the  size of the hernia over many years. He does not desire concurrent repair.  The risks and benefits of the procedure have been discussed at length with the patient.  The patient understands the proposed procedure, potential alternative treatments, and the course of recovery to be expected.  All of the patient's questions have been answered at this time.  The patient wishes to proceed with surgery.  Velora Heckler, MD, FACS General & Endocrine Surgery [facility]   Visit Diagnoses: 1. Right inguinal hernia     Primary Care Physician: Oliver Barre, MD

## 2012-06-26 NOTE — Anesthesia Postprocedure Evaluation (Signed)
  Anesthesia Post-op Note  Patient: Bradley Roberts  Procedure(s) Performed: Procedure(s): HERNIA REPAIR INGUINAL ADULT (Right) INSERTION OF MESH (Right)  Patient Location: PACU  Anesthesia Type:GA combined with regional for post-op pain  Level of Consciousness: awake and alert   Airway and Oxygen Therapy: Patient Spontanous Breathing  Post-op Pain: none  Post-op Assessment: Post-op Vital signs reviewed, Patient's Cardiovascular Status Stable, Respiratory Function Stable, Patent Airway, No signs of Nausea or vomiting and Pain level controlled  Post-op Vital Signs: stable  Complications: No apparent anesthesia complications

## 2012-06-26 NOTE — Progress Notes (Signed)
Assisted Dr. Kasik with right, ultrasound guided, transabdominal plane block. Side rails up, monitors on throughout procedure. See vital signs in flow sheet. Tolerated Procedure well. 

## 2012-06-26 NOTE — Interval H&P Note (Signed)
History and Physical Interval Note:  06/26/2012 10:08 AM  Bradley Roberts  has presented today for surgery, with the diagnosis of recurrent Right Inguinal hernia.  The various methods of treatment have been discussed with the patient and family. After consideration of risks, benefits and other options for treatment, the patient has consented to    Procedure(s): HERNIA REPAIR INGUINAL ADULT (Right) INSERTION OF MESH (Right) as a surgical intervention .    The patient's history has been reviewed, patient examined, no change in status, stable for surgery.  I have reviewed the patient's chart and labs.  Questions were answered to the patient's satisfaction.    Velora Heckler, MD, [facility] Office: [phone]    Bradley Roberts Judie Petit

## 2012-06-26 NOTE — Brief Op Note (Signed)
06/26/2012  11:50 AM  PATIENT:  Bradley Roberts  57 y.o. male  PRE-OPERATIVE DIAGNOSIS:  Recurrent Right Inguinal Hernia  POST-OPERATIVE DIAGNOSIS:  Recurrent Right Inguinal Hernia  PROCEDURE:  Procedure(s): HERNIA REPAIR INGUINAL ADULT (Right) INSERTION OF MESH (Right)  SURGEON:  Surgeon(s) and Role:    * Velora Heckler, MD - Primary  ANESTHESIA:   general  EBL:  Total I/O In: 1000 [I.V.:1000] Out: -   BLOOD ADMINISTERED:none  DRAINS: none   LOCAL MEDICATIONS USED:  MARCAINE     SPECIMEN:  No Specimen  DISPOSITION OF SPECIMEN:  N/A  COUNTS:  YES  TOURNIQUET:  * No tourniquets in log *  DICTATION: .Other Dictation: Dictation Number [id number]  PLAN OF CARE: Discharge to home after PACU  PATIENT DISPOSITION:  PACU - hemodynamically stable.   Delay start of Pharmacological VTE agent (>24hrs) due to surgical blood loss or risk of bleeding: yes  Velora Heckler, MD, [facility] Office: [phone]

## 2012-06-26 NOTE — Anesthesia Procedure Notes (Addendum)
Anesthesia Regional Block:  TAP block  Pre-Anesthetic Checklist: ,, timeout performed, Correct Patient, Correct Site, Correct Laterality, Correct Procedure, Correct Position, site marked, Risks and benefits discussed,  Surgical consent,  Pre-op evaluation,  At surgeon's request and post-op pain management  Laterality: Right  Prep: chloraprep       Needles:  Injection technique: Single-shot      Needle Gauge: 22 and 22 G    Additional Needles:  Procedures: ultrasound guided (picture in chart) TAP block Narrative:  Start time: 06/26/2012 9:41 AM End time: 06/26/2012 9:55 AM Injection made incrementally with aspirations every 5 mL. Anesthesiologist: Dr Gypsy Balsam  Additional Notes: 1610-9604 R TAP Block POP CHG prep, sterile tech #22 echo needle with good [place] visualization Marc .5% w/epi 1:200000 total 35cc Multiple neg asp Needle above the TA muscle plane at all times No compl Dr Gypsy Balsam   Procedure Name: Intubation Date/Time: 06/26/2012 10:30 AM Performed by: Zenia Resides D Pre-anesthesia Checklist: Patient identified, Emergency Drugs available, Suction available and Patient being monitored Patient Re-evaluated:Patient Re-evaluated prior to inductionOxygen Delivery Method: Circle System Utilized Preoxygenation: Pre-oxygenation with 100% oxygen Intubation Type: IV induction Ventilation: Mask ventilation without difficulty Laryngoscope Size: Mac and 3 Grade View: Grade I Tube type: Oral Number of attempts: 1 Airway Equipment and Method: stylet and oral airway Placement Confirmation: ETT inserted through vocal cords under direct vision,  positive ETCO2 and breath sounds checked- equal and bilateral Secured at: 23 cm Tube secured with: Tape Dental Injury: Teeth and Oropharynx as per pre-operative assessment

## 2012-06-26 NOTE — Transfer of Care (Signed)
Immediate Anesthesia Transfer of Care Note  Patient: Bradley Roberts  Procedure(s) Performed: Procedure(s): HERNIA REPAIR INGUINAL ADULT (Right) INSERTION OF MESH (Right)  Patient Location: PACU  Anesthesia Type:General and Regional  Level of Consciousness: awake, alert  and oriented  Airway & Oxygen Therapy: Patient Spontanous Breathing and Patient connected to face mask oxygen  Post-op Assessment: Report given to PACU RN and Post -op Vital signs reviewed and stable  Post vital signs: Reviewed and stable  Complications: No apparent anesthesia complications

## 2012-06-27 NOTE — Op Note (Signed)
NAME:  Bradley Roberts, Bradley Roberts NO.:  [id number]  MEDICAL RECORD NO.:  [id number]  LOCATION:  PERIO                        FACILITY:  [facility]  PHYSICIAN:  Velora Heckler, MD      DATE OF BIRTH:  August 01, 1955  DATE OF PROCEDURE:  06/26/2012                               OPERATIVE REPORT   PREOPERATIVE DIAGNOSIS:  Recurrent right inguinal hernia.  POSTOPERATIVE DIAGNOSIS:  Recurrent right inguinal hernia.  PROCEDURE:  Repair of recurrent right inguinal hernia with Ethicon Ultrapro mesh.  SURGEON:  Velora Heckler, MD, FACS  ANESTHESIA:  General per Dr. Bedelia Person.  ESTIMATED BLOOD LOSS:  Minimal.  PREPARATION:  ChloraPrep.  COMPLICATIONS:  None.  INDICATIONS:  The patient is a 57 year old white male referred by Dr. Oliver Barre for newly-diagnosed right inguinal hernia.  Over the past few months, the patient had noted a progressive bulge on the right groin. This was always been reducible.  He has had no signs or symptoms of intestinal obstruction.  The patient had, had a previous right inguinal hernia repair as a 32-year-old child in [place].  Details of the procedure are not available.  The patient now comes to Surgery for operative repair.  BODY OF REPORT:  Procedure was done in OR #1 at the [facility].  The patient was brought to the operating room, placed in a supine position on the operating room table.  Following administration of general anesthesia, the patient was positioned and then prepped and draped in the usual strict aseptic fashion.  After ascertaining that an adequate level of anesthesia had been achieved, the previous skin incision in the right groin was reopened with a #15 blade. Dissection was carried through subcutaneous tissues.  Scar tissue was mobilized so that the tissue planes can be identified.  External oblique fascia was incised in line of its fibers and extended through the external inguinal ring.  Cord structures were  dissected out of the inguinal canal with some difficulty using the electrocautery for hemostasis.  Tissue planes between the external oblique and internal oblique fascia were developed with the electrocautery used for hemostasis.  The inguinal ligament was dissected out.  The inguinal ligament was somewhat attenuated.  There also appeared to be an element of a femoral hernia.  There was certainly a moderate-to-large sized defect in the floor of the inguinal canal representing a recurrent direct inguinal hernia.  After dissecting out the anatomic landmarks, a 3 x 6 inch sheet of Ethicon Ultrapro mesh was brought on the field and trimmed to the appropriate dimensions for reinforcement of the inguinal floor.  An additional 1 inch x 6 inch strip of mesh was rolled into a plug.  It was inserted in the femoral canal so as to prevent development of a femoral hernia.  It was secured to Cooper's ligament with an interrupted 2-0 Novafil suture.  The Ethicon Ultrapro mesh was then secured to the pubic tubercle and along what remains of the inguinal ligament with a running 2-0 Novafil suture.  Mesh was split to accommodate the cord structures. Superior margin of the mesh was secured to the transversalis and internal oblique fascia with  interrupted 2-0 Novafil sutures.  Tails of the mesh were overlapped lateral to the cord structures, and extended laterally to reinforce this region.  The inferior edge of the tails of the mesh were secured to the inguinal ligament with interrupted 2-0 Novafil sutures.  Local field block was placed with Marcaine.  Cord structures were returned to the inguinal canal.  External oblique fascia was closed with interrupted 3-0 Vicryl sutures.  Subcutaneous tissues were closed with interrupted 3-0 Vicryl sutures.  Skin was anesthetized with local anesthetic.  Skin edges were reapproximated with a running 4- 0 Monocryl subcuticular suture.  Wound was washed and dried and  benzoin and Steri-Strips were applied.  Sterile dressings were applied.  The patient was awakened from anesthesia and brought to the recovery room. The patient tolerated the procedure well.   Velora Heckler, MD, [facility] Office: [phone]    TMG/MEDQ  D:  06/26/2012  T:  06/27/2012  Job:  [id number]  cc:   Corwin Levins, MD

## 2012-06-29 ENCOUNTER — Encounter (HOSPITAL_BASED_OUTPATIENT_CLINIC_OR_DEPARTMENT_OTHER): Payer: Self-pay | Admitting: Surgery

## 2012-07-03 ENCOUNTER — Telehealth (INDEPENDENT_AMBULATORY_CARE_PROVIDER_SITE_OTHER): Payer: Self-pay

## 2012-07-03 NOTE — Telephone Encounter (Signed)
Pt called to review work and activity restrictions po. Restrictions reviewed.

## 2012-07-08 ENCOUNTER — Encounter (INDEPENDENT_AMBULATORY_CARE_PROVIDER_SITE_OTHER): Payer: Self-pay | Admitting: Surgery

## 2012-07-08 ENCOUNTER — Ambulatory Visit (INDEPENDENT_AMBULATORY_CARE_PROVIDER_SITE_OTHER): Payer: PRIVATE HEALTH INSURANCE | Admitting: Surgery

## 2012-07-08 VITALS — BP 120/64 | HR 64 | Temp 99.2°F | Resp 16 | Ht 73.0 in | Wt 181.6 lb

## 2012-07-08 DIAGNOSIS — K4090 Unilateral inguinal hernia, without obstruction or gangrene, not specified as recurrent: Secondary | ICD-10-CM

## 2012-07-08 NOTE — Patient Instructions (Signed)
  COCOA BUTTER & VITAMIN E CREAM  (Palmer's or other brand)  Apply cocoa butter/vitamin E cream to your incision 2 - 3 times daily.  Massage cream into incision for one minute with each application.  Use sunscreen (50 SPF or higher) for first 6 months after surgery if area is exposed to sun.  You may substitute Mederma or other scar reducing creams as desired.   

## 2012-07-08 NOTE — Progress Notes (Signed)
General Surgery [facility]  Visit Diagnoses: 1. Right inguinal hernia     HISTORY: Patient is a 57 year old male who underwent right inguinal hernia repair with mesh on 06/26/2012. Postoperative course has been relatively straightforward. He has had no complications.  EXAM: Surgical incision is healing nicely. Mild soft tissue swelling. Steri-Strips are removed in the office. No sign of infection. Palpation in the inguinal canal with cough and Valsalva shows no sign of recurrence.  IMPRESSION: Status post right inguinal hernia repair with mesh  PLAN: The patient will gradually resume physical activity. All of aerobic activity is permitted at this time. I've asked him to avoid strenuous lifting. I've asked him to avoid all abdominal exercises.  Patient will begin applying topical creams to his incision.  Patient will return for final wound check in 6 weeks.  Velora Heckler, MD, FACS General & Endocrine Surgery [facility]

## 2012-08-12 ENCOUNTER — Encounter (INDEPENDENT_AMBULATORY_CARE_PROVIDER_SITE_OTHER): Payer: PRIVATE HEALTH INSURANCE | Admitting: Surgery

## 2012-09-07 ENCOUNTER — Ambulatory Visit (INDEPENDENT_AMBULATORY_CARE_PROVIDER_SITE_OTHER): Payer: PRIVATE HEALTH INSURANCE | Admitting: Surgery

## 2012-09-07 ENCOUNTER — Encounter (INDEPENDENT_AMBULATORY_CARE_PROVIDER_SITE_OTHER): Payer: Self-pay | Admitting: Surgery

## 2012-09-07 VITALS — BP 130/80 | HR 60 | Temp 98.5°F | Resp 14 | Ht 73.0 in | Wt 189.8 lb

## 2012-09-07 DIAGNOSIS — K4090 Unilateral inguinal hernia, without obstruction or gangrene, not specified as recurrent: Secondary | ICD-10-CM

## 2012-09-07 NOTE — Progress Notes (Signed)
General Surgery [facility]  Chief Complaint  Patient presents with  . Routine Post Op    recurrent RIH repair 06/26/2012    HISTORY: Patient is a 57 year old male who underwent repair of a recurrent right inguinal hernia with mesh in June 2014. Postoperative course has been uneventful. He returns for a final postoperative wound check.  EXAM: Surgical incision is well-healed. Minimal soft tissue swelling. With Valsalva and cough there is no sign of recurrence.  IMPRESSION: Status post right inguinal hernia repair with mesh  PLAN: The patient is released to full activity without restriction. He will gradually increase his exercise regimen.  Patient will return for surgical care as needed.  Velora Heckler, MD, FACS General & Endocrine Surgery [facility]   Visit Diagnoses: 1. Right inguinal hernia

## 2012-09-07 NOTE — Patient Instructions (Signed)
  COCOA BUTTER & VITAMIN E CREAM  (Palmer's or other brand)  Apply cocoa butter/vitamin E cream to your incision 2 - 3 times daily.  Massage cream into incision for one minute with each application.  Use sunscreen (50 SPF or higher) for first 6 months after surgery if area is exposed to sun.  You may substitute Mederma or other scar reducing creams as desired.   

## 2012-11-19 ENCOUNTER — Other Ambulatory Visit: Payer: Self-pay

## 2014-08-26 ENCOUNTER — Ambulatory Visit (INDEPENDENT_AMBULATORY_CARE_PROVIDER_SITE_OTHER): Payer: No Typology Code available for payment source | Admitting: Internal Medicine

## 2014-08-26 ENCOUNTER — Encounter: Payer: Self-pay | Admitting: Internal Medicine

## 2014-08-26 VITALS — BP 120/74 | HR 70 | Temp 98.4°F | Ht 73.0 in | Wt 199.2 lb

## 2014-08-26 DIAGNOSIS — Z0000 Encounter for general adult medical examination without abnormal findings: Secondary | ICD-10-CM

## 2014-08-26 DIAGNOSIS — R030 Elevated blood-pressure reading, without diagnosis of hypertension: Secondary | ICD-10-CM

## 2014-08-26 DIAGNOSIS — H029 Unspecified disorder of eyelid: Secondary | ICD-10-CM

## 2014-08-26 NOTE — Assessment & Plan Note (Signed)

## 2014-08-26 NOTE — Patient Instructions (Addendum)
Please continue to monitor your Blood Pressure on a regular (such as weekly) basis; please call if you have more often than not BP > 140/90 to consider even a low dose medication such as amlodipine 2.5 gm per day  Please continue your efforts at being more active with regular excercise, low cholesterol diet, lower salt intake, and weight control.  Please continue all other medications as before, and refills have been done if requested.  Please have the pharmacy call with any other refills you may need.  Please keep your appointments with your specialists as you may have planned  Please call if you have a preference for a referral for the skin tag removal for the left upper eyelid  Please return in 6 months, or sooner if needed

## 2014-08-26 NOTE — Progress Notes (Signed)
Subjective:    Patient ID: Bradley Roberts, male    DOB: Oct 04, 1955, 59 y.o.   MRN: [id number]  HPI Here for wellness and f/u;  Overall doing ok;  Pt denies Chest pain, worsening SOB, DOE, wheezing, orthopnea, PND, worsening LE edema, palpitations, dizziness or syncope.  Pt denies neurological change such as new headache, facial or extremity weakness.  Pt denies polydipsia, polyuria, or low sugar symptoms. Pt states overall good compliance with treatment and medications, good tolerability, and has been trying to follow appropriate diet.  Pt denies worsening depressive symptoms, suicidal ideation or panic. No fever, night sweats, wt loss, loss of appetite, or other constitutional symptoms.  Pt states good ability with ADL's, has low fall risk, home safety reviewed and adequate, no other significant changes in hearing or vision,  active with exercise with only mild overwt. BP Readings from Last 3 Encounters:  08/26/14 120/74  09/07/12 130/80  07/08/12 120/64  BP's have been twice borderline elevated recently last wk without illness or other stressor.  BP today WNL, pt eager to work on lifestyle rather than meds Also has a rather large skin tag left upper eyelid, needs removed but will call with request for name of provider for this, wants to ask his current girlfriend who is retinal specialist at [facility]. Past Medical History  Diagnosis Date  . HYPERLIPIDEMIA 09/13/2006  . ALLERGIC RHINITIS 11/03/2006  . GERD 09/15/2006  . 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 TESTICULAR MASS 11/03/2009  . SHOULDER PAIN, RIGHT 11/03/2006  . ANXIETY 09/15/2006  . OBSTRUCTIVE SLEEP APNEA 09/13/2006    test about 2002-mild-mod-did not use cpap-   Past Surgical History  Procedure Laterality Date  . Inguinal herniorrhapy      age 50-6 both rt and lt  . Arthroscopic repair acl  2008    rt  . Renal biopsy  1979    lt-  . Tonsillectomy    . Wisdom tooth extraction    . Inguinal hernia repair Right 06/26/2012    Procedure: HERNIA  REPAIR INGUINAL ADULT;  Surgeon: Earnstine Regal, MD;  Location: [place];  Service: General;  Laterality: Right;  . Insertion of mesh Right 06/26/2012    Procedure: INSERTION OF MESH;  Surgeon: Earnstine Regal, MD;  Location: [place];  Service: General;  Laterality: Right;    reports that he has never smoked. He has never used smokeless tobacco. He reports that he drinks alcohol. His drug history is not on file. family history includes Cancer in his father; Diabetes in his paternal grandmother; Heart attack in his father, maternal grandfather, and other. No Known Allergies Current Outpatient Prescriptions on File Prior to Visit  Medication Sig Dispense Refill  . aspirin 81 MG EC tablet Take 81 mg by mouth daily.       No current facility-administered medications on file prior to visit.   Review of Systems Constitutional: Negative for increased diaphoresis, other activity, appetite or siginficant weight change other than noted HENT: Negative for worsening hearing loss, ear pain, facial swelling, mouth sores and neck stiffness.   Eyes: Negative for other worsening pain, redness or visual disturbance.  Respiratory: Negative for shortness of breath and wheezing  Cardiovascular: Negative for chest pain and palpitations.  Gastrointestinal: Negative for diarrhea, blood in stool, abdominal distention or other pain Genitourinary: Negative for hematuria, flank pain or change in urine volume.  Musculoskeletal: Negative for myalgias or other joint complaints.  Skin: Negative for color change and wound or drainage.  Neurological: Negative for syncope and numbness. other than noted Hematological: Negative for adenopathy. or other swelling Psychiatric/Behavioral: Negative for hallucinations, SI, self-injury, decreased concentration or other worsening agitation.      Objective:   Physical Exam BP 120/74 mmHg  Pulse 70  Temp(Src) 98.4 F (36.9 C) (Oral)  Ht 6\' 1"   (1.854 m)  Wt 199 lb 4 oz (90.379 kg)  BMI 26.29 kg/m2  SpO2 96% VS noted,  Constitutional: Pt is oriented to person, place, and time. Appears well-developed and well-nourished, in no significant distress Head: Normocephalic and atraumatic.  Right Ear: External ear normal.  Left Ear: External ear normal.  Nose: Nose normal.  Mouth/Throat: Oropharynx is clear and moist.  Eyes: Conjunctivae and EOM are normal. Pupils are equal, round, and reactive to light.  Neck: Normal range of motion. Neck supple. No JVD present. No tracheal deviation present or significant neck LA or mass Cardiovascular: Normal rate, regular rhythm, normal heart sounds and intact distal pulses.   Pulmonary/Chest: Effort normal and breath sounds without rales or wheezing  Abdominal: Soft. Bowel sounds are normal. NT. No HSM  Musculoskeletal: Normal range of motion. Exhibits no edema.  Lymphadenopathy:  Has no cervical adenopathy.  Neurological: Pt is alert and oriented to person, place, and time. Pt has normal reflexes. No cranial nerve deficit. Motor grossly intact Skin: Skin is warm and dry. No rash noted.  Psychiatric:  Has normal mood and affect. Behavior is normal.     Assessment & Plan:

## 2014-08-26 NOTE — Progress Notes (Signed)
Pre visit review using our clinic review tool, if applicable. No additional management support is needed unless otherwise documented below in the visit note. 

## 2014-08-26 NOTE — Assessment & Plan Note (Signed)
Pt to call with name of provider he prefers to be referred

## 2014-08-26 NOTE — Assessment & Plan Note (Signed)
For lifestyle modification, needs 5-10 lb wt loss over next few month, then re-check BP, consider low dose amlod 2.5 if persistent elevated

## 2014-11-01 ENCOUNTER — Encounter: Payer: Self-pay | Admitting: Internal Medicine

## 2014-11-01 ENCOUNTER — Ambulatory Visit (INDEPENDENT_AMBULATORY_CARE_PROVIDER_SITE_OTHER): Payer: BLUE CROSS/BLUE SHIELD | Admitting: Internal Medicine

## 2014-11-01 VITALS — BP 128/90 | HR 72 | Temp 98.6°F | Ht 73.0 in | Wt 203.2 lb

## 2014-11-01 DIAGNOSIS — R1310 Dysphagia, unspecified: Secondary | ICD-10-CM | POA: Diagnosis not present

## 2014-11-01 DIAGNOSIS — K921 Melena: Secondary | ICD-10-CM | POA: Diagnosis not present

## 2014-11-01 DIAGNOSIS — R5383 Other fatigue: Secondary | ICD-10-CM

## 2014-11-01 DIAGNOSIS — M5416 Radiculopathy, lumbar region: Secondary | ICD-10-CM

## 2014-11-01 DIAGNOSIS — Z8249 Family history of ischemic heart disease and other diseases of the circulatory system: Secondary | ICD-10-CM

## 2014-11-01 MED ORDER — TRAMADOL HCL 50 MG PO TABS: 50.0000 mg | ORAL_TABLET | Freq: Three times a day (TID) | ORAL | Status: DC | PRN

## 2014-11-01 NOTE — Patient Instructions (Signed)
Reflux of gastric acid may be asymptomatic as this may occur mainly during sleep.The triggers for reflux  include stress; the "aspirin family" ; alcohol; peppermint; and caffeine (coffee, tea, cola, and chocolate). The aspirin family would include aspirin and the nonsteroidal agents such as ibuprofen &  Naproxen. Tylenol would not cause reflux. If having symptoms ; food & drink should be avoided for @ least 2 hours before going to bed.   Take the protein pump inhibitor Nexium  30 minutes before breakfast and 30 minutes before the evening meal for 8 weeks then go back to once a day  30 minutes before breakfast.  The best exercises for the low back include freestyle swimming, stretch aerobics, and yoga.Cybex & Nautilus machines rather than dead weights are better for the back.

## 2014-11-01 NOTE — Progress Notes (Signed)
   Subjective:    Patient ID: Bradley Roberts, male    DOB: Jan 08, 1956, 60 y.o.   MRN: [id number]  HPI He has multiple concerns. He's had fatigue for 2 months. He's also had pain in the lower back for the last week. There was no trigger or injury other than wrestling with his 30 year old son. It is now associated with pain on the right with radiation into the right inguinal area.  For the back he had taken 4-6 Advil a day for several days. He has a history of inguinal hernia repair. He has had some residual numbness after that in the inguinal area.  Last Wednesday he did have black tarry stools once only. Since that time the stools have been softer and more caramel colored. Today's he's had loose-watery stools.  Also he had dysphagia at least once last week. This has been intermittent rare phenomena up to 6-7 times in the past.  He takes 81 mg of aspirin a day; he has 2- 3 beers per week; he drinks 3-4 Cokes per week.  There is no family history of GI issues. He does have a family history of heart attack in the maternal grandmother @ 73; maternal grandfather @ 22; paternal grandfather @ 52; father @ 13. He has no recent lipids on record.  He is not on a heart healthy diet. He does walk 3 times a week 2-3 miles without cardio pulmonary symptoms.  Review of Systems  Unexplained weight loss, abdominal pain, significant dyspepsia, rectal bleeding, or persistently small caliber stools are denied.  Chest pain, palpitations, tachycardia, exertional dyspnea, paroxysmal nocturnal dyspnea, claudication or edema are absent.  After the review of above symptoms ; he mentioned that he has had some pain in the left calf which he feels was related stubbing his toe playing soccer with his son.     Objective:   Physical Exam Pertinent or positive findings include: He has a full beard and mustache. Varices are present in the left scrotum. Prostate is normal. Stool is light yellow & Hemoccult negative. Homans  sign is negative.  General appearance :adequately nourished; in no distress.  Eyes: No conjunctival inflammation or scleral icterus is present.  Oral exam:  Lips and gums are healthy appearing.There is no oropharyngeal erythema or exudate noted. Dental hygiene is good.  Heart:  Normal rate and regular rhythm. S1 and S2 normal without gallop, murmur, click, rub or other extra sounds    Lungs:Chest clear to auscultation; no wheezes, rhonchi,rales ,or rubs present.No increased work of breathing.   Abdomen: bowel sounds normal, soft and non-tender without masses, organomegaly or hernias noted.  No guarding or rebound. No flank tenderness to percussion.  Vascular : all pulses equal ; no bruits present. No leg edema.   Skin:Warm & dry.  Intact without suspicious lesions or rashes ; no tenting or jaundice   Lymphatic: No lymphadenopathy is noted about the head, neck, axilla, or inguinal areas.   Neuro: Strength, tone & DTRs normal.     Assessment & Plan:  #1 fatigue  #2 melena 1  #3 dysphagia  #4 lumbar radiculopathy  #5 calf discomfort; neg Homan's  #6 strong family history coronary disease  Plan: See orders and recommendations

## 2014-11-07 ENCOUNTER — Other Ambulatory Visit (INDEPENDENT_AMBULATORY_CARE_PROVIDER_SITE_OTHER): Payer: BLUE CROSS/BLUE SHIELD

## 2014-11-07 DIAGNOSIS — Z8249 Family history of ischemic heart disease and other diseases of the circulatory system: Secondary | ICD-10-CM | POA: Diagnosis not present

## 2014-11-07 DIAGNOSIS — R1310 Dysphagia, unspecified: Secondary | ICD-10-CM

## 2014-11-07 DIAGNOSIS — K921 Melena: Secondary | ICD-10-CM

## 2014-11-07 DIAGNOSIS — R7989 Other specified abnormal findings of blood chemistry: Secondary | ICD-10-CM | POA: Diagnosis not present

## 2014-11-07 DIAGNOSIS — R5383 Other fatigue: Secondary | ICD-10-CM | POA: Diagnosis not present

## 2014-11-07 LAB — CBC WITH DIFFERENTIAL/PLATELET
BASOS PCT: 0.5 % (ref 0.0–3.0)
Basophils Absolute: 0 10*3/uL (ref 0.0–0.1)
EOS PCT: 1.3 % (ref 0.0–5.0)
Eosinophils Absolute: 0.1 10*3/uL (ref 0.0–0.7)
HCT: 45.3 % (ref 39.0–52.0)
Hemoglobin: 15.4 g/dL (ref 13.0–17.0)
LYMPHS ABS: 2.3 10*3/uL (ref 0.7–4.0)
Lymphocytes Relative: 27.4 % (ref 12.0–46.0)
MCHC: 34.1 g/dL (ref 30.0–36.0)
MCV: 92.5 fl (ref 78.0–100.0)
MONOS PCT: 8.8 % (ref 3.0–12.0)
Monocytes Absolute: 0.7 10*3/uL (ref 0.1–1.0)
NEUTROS ABS: 5.1 10*3/uL (ref 1.4–7.7)
NEUTROS PCT: 62 % (ref 43.0–77.0)
PLATELETS: 203 10*3/uL (ref 150.0–400.0)
RBC: 4.89 Mil/uL (ref 4.22–5.81)
RDW: 13.1 % (ref 11.5–15.5)
WBC: 8.3 10*3/uL (ref 4.0–10.5)

## 2014-11-07 LAB — TSH: TSH: 2.38 u[IU]/mL (ref 0.35–4.50)

## 2014-11-07 LAB — BASIC METABOLIC PANEL
BUN: 15 mg/dL (ref 6–23)
CALCIUM: 9.6 mg/dL (ref 8.4–10.5)
CO2: 28 meq/L (ref 19–32)
Chloride: 104 mEq/L (ref 96–112)
Creatinine, Ser: 1.12 mg/dL (ref 0.40–1.50)
GFR: 71.62 mL/min (ref >60.00)
GLUCOSE: 95 mg/dL (ref 70–99)
POTASSIUM: 4.3 meq/L (ref 3.5–5.1)
SODIUM: 140 meq/L (ref 135–145)

## 2014-11-07 LAB — LIPID PANEL
CHOLESTEROL: 224 mg/dL — AB (ref 0–200)
HDL: 29.2 mg/dL — AB (ref >39.00)
NonHDL: 195.15
Total CHOL/HDL Ratio: 8
Triglycerides: 225 mg/dL — ABNORMAL HIGH (ref 0.0–149.0)
VLDL: 45 mg/dL — AB (ref 0.0–40.0)

## 2014-11-07 LAB — HEPATIC FUNCTION PANEL
ALBUMIN: 4.5 g/dL (ref 3.5–5.2)
ALT: 36 U/L (ref 0–53)
AST: 24 U/L (ref 0–37)
Alkaline Phosphatase: 53 U/L (ref 39–117)
Bilirubin, Direct: 0.1 mg/dL (ref 0.0–0.3)
Total Bilirubin: 0.7 mg/dL (ref 0.2–1.2)
Total Protein: 7.4 g/dL (ref 6.0–8.3)

## 2014-11-07 LAB — LDL CHOLESTEROL, DIRECT: Direct LDL: 150 mg/dL

## 2014-11-17 ENCOUNTER — Other Ambulatory Visit (INDEPENDENT_AMBULATORY_CARE_PROVIDER_SITE_OTHER): Payer: BLUE CROSS/BLUE SHIELD

## 2014-11-17 DIAGNOSIS — K219 Gastro-esophageal reflux disease without esophagitis: Secondary | ICD-10-CM

## 2014-11-17 LAB — HEMOCCULT SLIDES (X 3 CARDS)
Fecal Occult Blood: NEGATIVE
OCCULT 1: NEGATIVE
OCCULT 2: NEGATIVE
OCCULT 3: NEGATIVE
OCCULT 4: NEGATIVE
OCCULT 5: NEGATIVE

## 2015-06-14 ENCOUNTER — Ambulatory Visit (INDEPENDENT_AMBULATORY_CARE_PROVIDER_SITE_OTHER): Payer: BLUE CROSS/BLUE SHIELD | Admitting: Internal Medicine

## 2015-06-14 ENCOUNTER — Encounter: Payer: Self-pay | Admitting: Internal Medicine

## 2015-06-14 VITALS — BP 132/68 | HR 88 | Temp 98.3°F | Resp 20 | Wt 210.0 lb

## 2015-06-14 DIAGNOSIS — E785 Hyperlipidemia, unspecified: Secondary | ICD-10-CM | POA: Diagnosis not present

## 2015-06-14 DIAGNOSIS — Z0001 Encounter for general adult medical examination with abnormal findings: Secondary | ICD-10-CM

## 2015-06-14 DIAGNOSIS — R6889 Other general symptoms and signs: Secondary | ICD-10-CM

## 2015-06-14 DIAGNOSIS — H029 Unspecified disorder of eyelid: Secondary | ICD-10-CM

## 2015-06-14 DIAGNOSIS — R21 Rash and other nonspecific skin eruption: Secondary | ICD-10-CM

## 2015-06-14 MED ORDER — FLUOCINONIDE-E 0.05 % EX CREA: 1.0000 "application " | TOPICAL_CREAM | Freq: Two times a day (BID) | CUTANEOUS | Status: DC

## 2015-06-14 NOTE — Progress Notes (Signed)
Pre visit review using our clinic review tool, if applicable. No additional management support is needed unless otherwise documented below in the visit note. 

## 2015-06-14 NOTE — Patient Instructions (Addendum)
Please take all new medication as prescribed - the steroid cream  Please continue all other medications as before, and refills have been done if requested.  Please have the pharmacy call with any other refills you may need.  Please keep your appointments with your specialists as you may have planned  You will be contacted regarding the referral for: opthamology (oculoplastics)  Please continue your efforts at being more active, low cholesterol diet, and weight control.  Please return in 6 months, or sooner if needed, with Lab testing done 3-5 days before

## 2015-06-14 NOTE — Progress Notes (Signed)
Subjective:    Patient ID: Bradley Roberts, male    DOB: 11/29/1955, 60 y.o.   MRN: [id number]  HPI  Here with 2 wks onset nontender about 1.5 cm slightly raised erythem lesion to post right hand near mid 2nd dorsal compartment, with mild itchy, no pain, red streaks, drainage or trauma.  Has hx of exact same lesion last year better with otc cortisone, but not helping this year.  Also has an enlarging raised left lateral upper eyelid nontender fleshy lesion, has girlfriend opthamologist who recommended he ask for an oculoplastics optho referral.  Pt denies chest pain, increased sob or doe, wheezing, orthopnea, PND, increased LE swelling, palpitations, dizziness or syncope.  Not really been trying to follow a lower chol diet, but is wondering about last labs. Pt denies new neurological symptoms such as new headache, or facial or extremity weakness or numbness Past Medical History  Diagnosis Date  . HYPERLIPIDEMIA 09/13/2006  . ALLERGIC RHINITIS 11/03/2006  . GERD 09/15/2006  . 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 TESTICULAR MASS 11/03/2009  . SHOULDER PAIN, RIGHT 11/03/2006  . ANXIETY 09/15/2006  . OBSTRUCTIVE SLEEP APNEA 09/13/2006    test about 2002-mild-mod-did not use cpap-   Past Surgical History  Procedure Laterality Date  . Inguinal herniorrhapy      age [age] both rt and lt  . Arthroscopic repair acl  2008    rt  . Renal biopsy  1979    lt-  . Tonsillectomy    . Wisdom tooth extraction    . Inguinal hernia repair Right 06/26/2012    Procedure: HERNIA REPAIR INGUINAL ADULT;  Surgeon: Earnstine Regal, MD;  Location: [facility];  Service: General;  Laterality: Right;  . Insertion of mesh Right 06/26/2012    Procedure: INSERTION OF MESH;  Surgeon: Earnstine Regal, MD;  Location: [facility];  Service: General;  Laterality: Right;    reports that he has never smoked. He has never used smokeless tobacco. He reports that he drinks alcohol. His drug history is not on  file. family history includes Cancer in his father; Diabetes in his paternal grandmother; Heart attack in his father, maternal grandfather, and other. No Known Allergies Current Outpatient Prescriptions on File Prior to Visit  Medication Sig Dispense Refill  . aspirin 81 MG EC tablet Take 81 mg by mouth daily.      Marland Kitchen esomeprazole (NEXIUM) 20 MG capsule Take 20 mg by mouth daily at 12 noon.    . traMADol (ULTRAM) 50 MG tablet Take 1 tablet (50 mg total) by mouth every 8 (eight) hours as needed. 30 tablet 0   No current facility-administered medications on file prior to visit.   Review of Systems  Constitutional: Negative for unusual diaphoresis or night sweats HENT: Negative for ear swelling or discharge Eyes: Negative for worsening visual haziness  Respiratory: Negative for choking and stridor.   Gastrointestinal: Negative for distension or worsening eructation Genitourinary: Negative for retention or change in urine volume.  Musculoskeletal: Negative for other MSK pain or swelling Skin: Negative for color change and worsening wound Neurological: Negative for tremors and numbness other than noted  Psychiatric/Behavioral: Negative for decreased concentration or agitation other than above       Objective:   Physical Exam BP 132/68 mmHg  Pulse 88  Temp(Src) 98.3 F (36.8 C) (Oral)  Resp 20  Wt 210 lb (95.255 kg)  SpO2 96% VS noted, not ill appearing Constitutional: Pt appears in no apparent distress HENT:  Head: NCAT.  Right Ear: External ear normal.  Left Ear: External ear normal.  Eyes: . Pupils are equal, round, and reactive to light. Conjunctivae and EOM are normal Neck: Normal range of motion. Neck supple.  Cardiovascular: Normal rate and regular rhythm.   Pulmonary/Chest: Effort normal and breath sounds without rales or wheezing.  Abd:  Soft, NT, ND, + BS Neurological: Pt is alert. Not confused , motor grossly intact Skin: Skin is warm. + 1.5 cm nontender rash erythem  nontender, no LE edema, also left upper eyelid lesion fleshy 10 mm raised and interfering with vision Psychiatric: Pt behavior is normal. No agitation.    Assessment & Plan:

## 2015-06-14 NOTE — Assessment & Plan Note (Signed)
Last LDL: 150 in oct 2016, o/w stable overall by history and exam, recent data reviewed with pt, and pt to continue medical treatment as before - declines statin,  to f/u any worsening symptoms or concerns, for lower chol diet

## 2015-06-14 NOTE — Assessment & Plan Note (Signed)
Enlarging, for oculoplastics referral,  to f/u any worsening symptoms or concerns

## 2015-06-14 NOTE — Assessment & Plan Note (Signed)
C/w prob atopic dermatitis, for lidex cr asd prn,  to f/u any worsening symptoms or concerns

## 2015-08-31 DIAGNOSIS — L821 Other seborrheic keratosis: Secondary | ICD-10-CM | POA: Diagnosis not present

## 2015-09-20 DIAGNOSIS — H40013 Open angle with borderline findings, low risk, bilateral: Secondary | ICD-10-CM | POA: Diagnosis not present

## 2015-10-27 ENCOUNTER — Ambulatory Visit: Payer: BLUE CROSS/BLUE SHIELD | Admitting: Adult Health

## 2015-10-30 ENCOUNTER — Encounter: Payer: Self-pay | Admitting: Nurse Practitioner

## 2015-10-30 ENCOUNTER — Ambulatory Visit (INDEPENDENT_AMBULATORY_CARE_PROVIDER_SITE_OTHER): Payer: BLUE CROSS/BLUE SHIELD | Admitting: Nurse Practitioner

## 2015-10-30 ENCOUNTER — Other Ambulatory Visit (INDEPENDENT_AMBULATORY_CARE_PROVIDER_SITE_OTHER): Payer: BLUE CROSS/BLUE SHIELD

## 2015-10-30 ENCOUNTER — Telehealth: Payer: Self-pay | Admitting: Internal Medicine

## 2015-10-30 ENCOUNTER — Other Ambulatory Visit: Payer: Self-pay | Admitting: *Deleted

## 2015-10-30 ENCOUNTER — Ambulatory Visit: Payer: BLUE CROSS/BLUE SHIELD | Admitting: Nurse Practitioner

## 2015-10-30 VITALS — BP 146/92 | HR 88 | Temp 97.0°F | Ht 73.0 in | Wt 212.0 lb

## 2015-10-30 DIAGNOSIS — R569 Unspecified convulsions: Secondary | ICD-10-CM | POA: Diagnosis not present

## 2015-10-30 DIAGNOSIS — R55 Syncope and collapse: Secondary | ICD-10-CM

## 2015-10-30 LAB — URINALYSIS, ROUTINE W REFLEX MICROSCOPIC
Bilirubin Urine: NEGATIVE
Hgb urine dipstick: NEGATIVE
KETONES UR: NEGATIVE
Leukocytes, UA: NEGATIVE
Nitrite: NEGATIVE
PH: 6 (ref 5.0–8.0)
RBC / HPF: NONE SEEN (ref 0–?)
Total Protein, Urine: NEGATIVE
URINE GLUCOSE: NEGATIVE
UROBILINOGEN UA: 0.2 (ref 0.0–1.0)
WBC UA: NONE SEEN (ref 0–?)

## 2015-10-30 LAB — TSH: TSH: 3.8 u[IU]/mL (ref 0.35–4.50)

## 2015-10-30 LAB — COMPREHENSIVE METABOLIC PANEL
ALBUMIN: 4.4 g/dL (ref 3.5–5.2)
ALK PHOS: 61 U/L (ref 39–117)
ALT: 21 U/L (ref 0–53)
AST: 16 U/L (ref 0–37)
BILIRUBIN TOTAL: 0.5 mg/dL (ref 0.2–1.2)
BUN: 15 mg/dL (ref 6–23)
CALCIUM: 9.3 mg/dL (ref 8.4–10.5)
CO2: 28 meq/L (ref 19–32)
CREATININE: 1.22 mg/dL (ref 0.40–1.50)
Chloride: 106 mEq/L (ref 96–112)
GFR: 64.67 mL/min (ref >60.00)
Glucose, Bld: 89 mg/dL (ref 70–99)
Potassium: 3.8 mEq/L (ref 3.5–5.1)
Sodium: 142 mEq/L (ref 135–145)
TOTAL PROTEIN: 7.4 g/dL (ref 6.0–8.3)

## 2015-10-30 LAB — CBC WITH DIFFERENTIAL/PLATELET
BASOS ABS: 0 10*3/uL (ref 0.0–0.1)
Basophils Relative: 0.4 % (ref 0.0–3.0)
EOS ABS: 0.1 10*3/uL (ref 0.0–0.7)
Eosinophils Relative: 1.7 % (ref 0.0–5.0)
HEMATOCRIT: 42.9 % (ref 39.0–52.0)
HEMOGLOBIN: 15 g/dL (ref 13.0–17.0)
LYMPHS PCT: 23.8 % (ref 12.0–46.0)
Lymphs Abs: 2.1 10*3/uL (ref 0.7–4.0)
MCHC: 34.9 g/dL (ref 30.0–36.0)
MCV: 91 fl (ref 78.0–100.0)
MONOS PCT: 9.3 % (ref 3.0–12.0)
Monocytes Absolute: 0.8 10*3/uL (ref 0.1–1.0)
NEUTROS ABS: 5.6 10*3/uL (ref 1.4–7.7)
Neutrophils Relative %: 64.8 % (ref 43.0–77.0)
PLATELETS: 198 10*3/uL (ref 150.0–400.0)
RBC: 4.72 Mil/uL (ref 4.22–5.81)
RDW: 13.1 % (ref 11.5–15.5)
WBC: 8.6 10*3/uL (ref 4.0–10.5)

## 2015-10-30 NOTE — Patient Instructions (Addendum)
Sign medical release to get records from [facility] in [place]. You will be called with appt to see neurologist. Go to basement for lab draw. Avoid driving till evaluated by neurologist

## 2015-10-30 NOTE — Progress Notes (Signed)
Subjective:  Patient ID: Bradley Roberts, male    DOB: 1955-11-25  Age: 59 y.o. MRN: [id number]  CC: Seizures (follow up for seizure episode)   Seizures   This is a new problem. Episode onset: 4days ago while at football game. The problem has been resolved. Number of times: 2episodes 4days ago. Associated symptoms include muscle weakness. Pertinent negatives include no sleepiness, no confusion, no headaches, no speech difficulty, no visual disturbance, no neck stiffness, no sore throat, no chest pain, no cough, no nausea, no vomiting and no diarrhea. Characteristics include eye deviation, rhythmic jerking, loss of consciousness and bit tongue. Characteristics do not include eye blinking, bowel incontinence, bladder incontinence, apnea or cyanosis. The episode was witnessed (felt lightheaded prior to loss of consciousness). There was no sensation of an aura present. Focality: denies any ETOH or drug abuse. Possible causes do not include medication or dosage change, sleep deprivation, missed seizure meds, recent illness or change in alcohol use. There has been no fever. There were no medications administered prior to arrival.  he was transported by EMS personnel to [facility] center in [place], [place] for further evaluation.  At the hospital the following were done: CXR, ECG, head CT, and labs. Patient is unaware of the specific reports. He was advised to f/up with his primary for further evaluation.   no FH of seizures. FH of CAD/MI (father at age 85).   unable to retrieve [facility] records via Care everywhere. Will fax signed medical release form.  Outpatient Medications Prior to Visit  Medication Sig Dispense Refill  . AFLURIA PRESERVATIVE FREE 0.5 ML SUSY inject 0.5 milliliter intramuscularly  0  . aspirin 81 MG EC tablet Take 81 mg by mouth daily.      Marland Kitchen erythromycin ophthalmic ointment apply to BOTH UPPER EYELIDS twice a day for 1 week  0  . esomeprazole (NEXIUM) 20 MG capsule Take 20 mg by  mouth daily at 12 noon.    . fluocinonide-emollient (LIDEX-E) 0.05 % cream Apply 1 application topically 2 (two) times daily. 30 g 1  . traMADol (ULTRAM) 50 MG tablet Take 1 tablet (50 mg total) by mouth every 8 (eight) hours as needed. 30 tablet 0   No facility-administered medications prior to visit.     ROS See HPI  Objective:  BP (!) 146/92 (BP Location: Left Arm, Patient Position: Sitting, Cuff Size: Normal)   Pulse 88   Temp 97 F (36.1 C)   Ht _0  (1.854 m)   Wt 212 lb (96.2 kg)   SpO2 98%   BMI 27.97 kg/m   BP Readings from Last 3 Encounters:  10/30/15 (!) 146/92  06/14/15 132/68  11/01/14 128/90    Wt Readings from Last 3 Encounters:  10/30/15 212 lb (96.2 kg)  06/14/15 210 lb (95.3 kg)  11/01/14 203 lb 4 oz (92.2 kg)    Physical Exam  Constitutional: He is oriented to person, place, and time. No distress.  HENT:  Mouth/Throat: No oropharyngeal exudate.  Eyes: EOM are normal. Pupils are equal, round, and reactive to light.  Neck: Normal range of motion. Neck supple.  Cardiovascular: Normal rate, regular rhythm, normal heart sounds and intact distal pulses.  Exam reveals no gallop and no friction rub.   No murmur heard. Pulmonary/Chest: Effort normal and breath sounds normal.  Neurological: He is alert and oriented to person, place, and time. No cranial nerve deficit.  Skin: Skin is warm and dry.  Psychiatric: He has a normal mood and  affect. His behavior is normal. Judgment and thought content normal.  Vitals reviewed.   Lab Results  Component Value Date   WBC 8.3 11/07/2014   HGB 15.4 11/07/2014   HCT 45.3 11/07/2014   PLT 203.0 11/07/2014   GLUCOSE 95 11/07/2014   CHOL 224 (H) 11/07/2014   TRIG 225.0 (H) 11/07/2014   HDL 29.20 (L) 11/07/2014   LDLDIRECT 150.0 11/07/2014   ALT 36 11/07/2014   AST 24 11/07/2014   NA 140 11/07/2014   K 4.3 11/07/2014   CL 104 11/07/2014   CREATININE 1.12 11/07/2014   BUN 15 11/07/2014   CO2 28 11/07/2014    TSH 2.38 11/07/2014    No results found.  Assessment & Plan:   Bradley was seen today for seizures.  Diagnoses and all orders for this visit:  Syncope and collapse -     CBC w/Diff; Future -     Comp Met (CMET); Future -     TSH; Future -     Ambulatory referral to Neurology -     Urinalysis, Routine w reflex microscopic; Future -     VAS US CAROTID; Future  Observed seizure-like activity (HCC) -     CBC w/Diff; Future -     Comp Met (CMET); Future -     TSH; Future -     Ambulatory referral to Neurology -     Urinalysis, Routine w reflex microscopic; Future -     VAS US CAROTID; Future   I am having [name] maintain his aspirin, esomeprazole, traMADol, fluocinonide-emollient, AFLURIA PRESERVATIVE FREE, and erythromycin.  No orders of the defined types were placed in this encounter.   Follow-up: Return if symptoms worsen or fail to improve.  Wilfred Lacy, NP

## 2015-10-30 NOTE — Telephone Encounter (Signed)
Can you also make it say to [facility]. thanks

## 2015-10-30 NOTE — Progress Notes (Signed)
Normal results, see office note

## 2015-10-30 NOTE — Telephone Encounter (Signed)
Pt is scheduled with [facility], but also wants to see someone locally at the later time. Can you please put a new urgent referral in to neurology. Thank you

## 2015-10-30 NOTE — Progress Notes (Signed)
Pre visit review using our clinic review tool, if applicable. No additional management support is needed unless otherwise documented below in the visit note. 

## 2015-10-31 DIAGNOSIS — R569 Unspecified convulsions: Secondary | ICD-10-CM | POA: Diagnosis not present

## 2015-10-31 NOTE — Telephone Encounter (Signed)
none

## 2015-11-14 ENCOUNTER — Telehealth: Payer: Self-pay | Admitting: Emergency Medicine

## 2015-11-14 DIAGNOSIS — R569 Unspecified convulsions: Secondary | ICD-10-CM | POA: Diagnosis not present

## 2015-11-14 NOTE — Telephone Encounter (Signed)
Pt is calling stating he has had a lot of test done since he had a a seizure. He wants to know if you can call him so he can make sure he is having all the correct test done. Please advise thanks.

## 2015-11-15 NOTE — Telephone Encounter (Addendum)
Very sorry, I am unable to do phone consultations to patients at this time  This question falls outside my scope of practice as well.  I would follow up with your neurologist to determine the answer to the concern, thanks

## 2015-11-20 ENCOUNTER — Ambulatory Visit (HOSPITAL_COMMUNITY): Admission: RE | Admit: 2015-11-20 | Payer: BLUE CROSS/BLUE SHIELD | Source: Ambulatory Visit

## 2015-11-21 ENCOUNTER — Ambulatory Visit (INDEPENDENT_AMBULATORY_CARE_PROVIDER_SITE_OTHER): Payer: BLUE CROSS/BLUE SHIELD | Admitting: Internal Medicine

## 2015-11-21 ENCOUNTER — Telehealth: Payer: Self-pay | Admitting: Internal Medicine

## 2015-11-21 ENCOUNTER — Encounter: Payer: Self-pay | Admitting: Internal Medicine

## 2015-11-21 ENCOUNTER — Other Ambulatory Visit (INDEPENDENT_AMBULATORY_CARE_PROVIDER_SITE_OTHER): Payer: BLUE CROSS/BLUE SHIELD

## 2015-11-21 VITALS — BP 138/76 | HR 68 | Temp 98.4°F | Resp 20 | Wt 207.0 lb

## 2015-11-21 DIAGNOSIS — Z0000 Encounter for general adult medical examination without abnormal findings: Secondary | ICD-10-CM

## 2015-11-21 DIAGNOSIS — G4733 Obstructive sleep apnea (adult) (pediatric): Secondary | ICD-10-CM

## 2015-11-21 DIAGNOSIS — G8929 Other chronic pain: Secondary | ICD-10-CM | POA: Diagnosis not present

## 2015-11-21 DIAGNOSIS — Z1159 Encounter for screening for other viral diseases: Secondary | ICD-10-CM | POA: Diagnosis not present

## 2015-11-21 DIAGNOSIS — R310 Gross hematuria: Secondary | ICD-10-CM | POA: Diagnosis not present

## 2015-11-21 DIAGNOSIS — Z0001 Encounter for general adult medical examination with abnormal findings: Secondary | ICD-10-CM | POA: Diagnosis not present

## 2015-11-21 DIAGNOSIS — M545 Low back pain, unspecified: Secondary | ICD-10-CM | POA: Insufficient documentation

## 2015-11-21 DIAGNOSIS — Z1211 Encounter for screening for malignant neoplasm of colon: Secondary | ICD-10-CM | POA: Diagnosis not present

## 2015-11-21 LAB — LIPID PANEL
CHOL/HDL RATIO: 7
Cholesterol: 228 mg/dL — ABNORMAL HIGH (ref 0–200)
HDL: 30.9 mg/dL — ABNORMAL LOW (ref >39.00)
LDL CALC: 161 mg/dL — AB (ref 0–99)
NONHDL: 197.32
TRIGLYCERIDES: 182 mg/dL — AB (ref 0.0–149.0)
VLDL: 36.4 mg/dL (ref 0.0–40.0)

## 2015-11-21 LAB — PSA: PSA: 3.11 ng/mL (ref 0.10–4.00)

## 2015-11-21 NOTE — Telephone Encounter (Signed)
Patient forgot to mention to you that he just had an mri , and is asking if the upcoming cartoid U/S is most appropriate at this time. He was wondering if an MRA might be more appropriate down the road.

## 2015-11-21 NOTE — Progress Notes (Signed)
Pre visit review using our clinic review tool, if applicable. No additional management support is needed unless otherwise documented below in the visit note. 

## 2015-11-21 NOTE — Telephone Encounter (Signed)
The carotid u/s should be good for f/u, as the MRA is just not that much better and involved dye and higher risk

## 2015-11-21 NOTE — Progress Notes (Signed)
Subjective:    Patient ID: Bradley Roberts, male    DOB: 25-Dec-1955, 60 y.o.   MRN: [id number]  HPI   Here for wellness and f/u;  Overall doing ok;  Pt denies Chest pain, worsening SOB, DOE, wheezing, orthopnea, PND, worsening LE edema, palpitations, dizziness or syncope.  Pt denies neurological change such as new headache, facial or extremity weakness.  Pt denies polydipsia, polyuria, or low sugar symptoms. Pt states overall good compliance with treatment and medications, good tolerability, and has been trying to follow appropriate diet.  Pt denies worsening depressive symptoms, suicidal ideation or panic. No fever, night sweats, wt loss, loss of appetite, or other constitutional symptoms.  Pt states good ability with ADL's, has low fall risk, home safety reviewed and adequate, no other significant changes in hearing or vision, and only occasionally active with exercise. Wt Readings from Last 3 Encounters:  11/21/15 207 lb (93.9 kg)  10/30/15 212 lb (96.2 kg)  06/14/15 210 lb (95.3 kg)  Believes he had colonoscopy at 52 but not sure, also had EGD, local GI  Did have recent siezure oct 12, with CT head and CXR in the [facility] 0 neg per pt report;  Has seen [facility], oct 2017, MRI and EEG neg, has f/u nov 17.   Has right lower back pain, tender, mild, persistent x 6-8 months after 1 episode gross hemautira, seen at UC, no further evaluation done. Most recent UA neg.  Pt does not want CT or urology now since was so long ago, has midl bilat lower back discomfort,without bowel or bladder change, fever, wt loss,  worsening LE pain/numbness/weakness, gait change or falls.   Also has hx of mod OSA after sleep study about 12 yrs ago for reasons of upper airway resistance; Asks for f/u Past Medical History:  Diagnosis Date  . ALLERGIC RHINITIS 11/03/2006  . ANXIETY 09/15/2006  . 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 GERD 09/15/2006  . HYPERLIPIDEMIA 09/13/2006  . OBSTRUCTIVE SLEEP APNEA  09/13/2006   test about 2002-mild-mod-did not use cpap-  . SHOULDER PAIN, RIGHT 11/03/2006  . TESTICULAR MASS 11/03/2009   Past Surgical History:  Procedure Laterality Date  . ARTHROSCOPIC REPAIR ACL  2008   rt  . INGUINAL HERNIA REPAIR Right 06/26/2012   Procedure: HERNIA REPAIR INGUINAL ADULT;  Surgeon: Earnstine Regal, MD;  Location: [facility];  Service: General;  Laterality: Right;  . inguinal herniorrhapy     age [age] both rt and lt  . INSERTION OF MESH Right 06/26/2012   Procedure: INSERTION OF MESH;  Surgeon: Earnstine Regal, MD;  Location: [facility];  Service: General;  Laterality: Right;  . RENAL BIOPSY  1979   lt-  . TONSILLECTOMY    . WISDOM TOOTH EXTRACTION      reports that he has never smoked. He has never used smokeless tobacco. He reports that he drinks alcohol. His drug history is not on file. family history includes Cancer in his father; Diabetes in his paternal grandmother; Heart attack in his father, maternal grandfather, and other. No Known Allergies Current Outpatient Prescriptions on File Prior to Visit  Medication Sig Dispense Refill  . AFLURIA PRESERVATIVE FREE 0.5 ML SUSY inject 0.5 milliliter intramuscularly  0  . aspirin 81 MG EC tablet Take 81 mg by mouth daily.      Marland Kitchen erythromycin ophthalmic ointment apply to BOTH UPPER EYELIDS twice a day for 1 week  0  . esomeprazole (NEXIUM) 20 MG  capsule Take 20 mg by mouth daily at 12 noon.    . fluocinonide-emollient (LIDEX-E) 0.05 % cream Apply 1 application topically 2 (two) times daily. 30 g 1  . traMADol (ULTRAM) 50 MG tablet Take 1 tablet (50 mg total) by mouth every 8 (eight) hours as needed. 30 tablet 0   No current facility-administered medications on file prior to visit.     Review of Systems Constitutional: Negative for increased diaphoresis, or other activity, appetite or siginficant weight change other than noted HENT: Negative for worsening hearing loss, ear pain, facial  swelling, mouth sores and neck stiffness.   Eyes: Negative for other worsening pain, redness or visual disturbance.  Respiratory: Negative for choking or stridor Cardiovascular: Negative for other chest pain and palpitations.  Gastrointestinal: Negative for worsening diarrhea, blood in stool, or abdominal distention Genitourinary: Negative for hematuria, flank pain or change in urine volume.  Musculoskeletal: Negative for myalgias or other joint complaints.  Skin: Negative for other color change and wound or drainage.  Neurological: Negative for syncope and numbness. other than noted Hematological: Negative for adenopathy. or other swelling Psychiatric/Behavioral: Negative for hallucinations, SI, self-injury, decreased concentration or other worsening agitation.  All other system neg per pt     Objective:   Physical Exam BP 138/76   Pulse 68   Temp 98.4 F (36.9 C) (Oral)   Resp 20   Wt 207 lb (93.9 kg)   SpO2 98%   BMI 27.31 kg/m  VS noted,  Constitutional: Pt is oriented to person, place, and time. Appears well-developed and well-nourished, in no significant distress Head: Normocephalic and atraumatic  Eyes: Conjunctivae and EOM are normal. Pupils are equal, round, and reactive to light Right Ear: External ear normal.  Left Ear: External ear normal Nose: Nose normal.  Mouth/Throat: Oropharynx is clear and moist  Neck: Normal range of motion. Neck supple. No JVD present. No tracheal deviation present or significant neck LA or mass Cardiovascular: Normal rate, regular rhythm, normal heart sounds and intact distal pulses.   Pulmonary/Chest: Effort normal and breath sounds without rales or wheezing  Abdominal: Soft. Bowel sounds are normal. NT. No HSM  Musculoskeletal: Normal range of motion. Exhibits no edema, spine nontender Lymphadenopathy: Has no cervical adenopathy.  Neurological: Pt is alert and oriented to person, place, and time. Pt has normal reflexes. No cranial nerve  deficit. Motor grossly intact Skin: Skin is warm and dry. No rash noted or new ulcers Psychiatric:  Has normal mood and affect. Behavior is normal. ' No other new exam findings  MRI of the Brain without and with Contrast: - Nov 14 2015 IMPRESSION:  1. No acute intracranial abnormality identified. 2. Slightly asymmetric enlargement of anterior right temporal horn as compared to the left. Please see above discussion regarding this finding.     Assessment & Plan:

## 2015-11-21 NOTE — Patient Instructions (Addendum)

## 2015-11-22 ENCOUNTER — Encounter: Payer: Self-pay | Admitting: Internal Medicine

## 2015-11-22 ENCOUNTER — Other Ambulatory Visit: Payer: Self-pay | Admitting: Internal Medicine

## 2015-11-22 LAB — HEPATITIS C ANTIBODY: HCV AB: NEGATIVE

## 2015-11-22 MED ORDER — ROSUVASTATIN CALCIUM 20 MG PO TABS: 20.0000 mg | ORAL_TABLET | Freq: Every day | ORAL | 3 refills | Status: DC

## 2015-11-23 ENCOUNTER — Ambulatory Visit (HOSPITAL_COMMUNITY): Payer: BLUE CROSS/BLUE SHIELD

## 2015-11-23 ENCOUNTER — Ambulatory Visit (HOSPITAL_COMMUNITY)
Admission: RE | Admit: 2015-11-23 | Discharge: 2015-11-23 | Disposition: A | Discharge status: Home / Self Care | Payer: BLUE CROSS/BLUE SHIELD | Source: Ambulatory Visit | Attending: Nurse Practitioner | Admitting: Nurse Practitioner

## 2015-11-23 DIAGNOSIS — R569 Unspecified convulsions: Secondary | ICD-10-CM | POA: Insufficient documentation

## 2015-11-23 DIAGNOSIS — I6523 Occlusion and stenosis of bilateral carotid arteries: Secondary | ICD-10-CM | POA: Diagnosis not present

## 2015-11-23 DIAGNOSIS — R55 Syncope and collapse: Secondary | ICD-10-CM | POA: Diagnosis not present

## 2015-11-23 LAB — VAS US CAROTID
LEFT ECA DIAS: -11 cm/s
LEFT VERTEBRAL DIAS: 23 cm/s
Left CCA dist dias: -23 cm/s
Left CCA dist sys: -86 cm/s
Left CCA prox dias: 20 cm/s
Left CCA prox sys: 87 cm/s
Left ICA dist dias: -36 cm/s
Left ICA dist sys: 83 cm/s
Left ICA prox dias: -10 cm/s
Left ICA prox sys: -30 cm/s
RIGHT ECA DIAS: -17 cm/s
RIGHT VERTEBRAL DIAS: 13 cm/s
Right CCA prox dias: 21 cm/s
Right CCA prox sys: 69 cm/s
Right cca dist sys: -77 cm/s

## 2015-11-23 NOTE — Progress Notes (Signed)
*  PRELIMINARY RESULTS* Vascular Ultrasound Carotid Duplex (Doppler) has been completed.   Findings suggest 1-39% internal carotid artery stenosis bilaterally. Vertebral arteries are patent with antegrade flow.  11/23/2015 6:44 PM Maudry Mayhew, BS, RVT, RDCS, RDMS

## 2015-11-24 ENCOUNTER — Telehealth: Payer: Self-pay

## 2015-11-24 DIAGNOSIS — G4733 Obstructive sleep apnea (adult) (pediatric): Secondary | ICD-10-CM

## 2015-11-24 NOTE — Telephone Encounter (Signed)
Ok for referral - done 

## 2015-11-24 NOTE — Telephone Encounter (Signed)
Left messagae advising patient that referral has been placed, that dept should call him to schedule

## 2015-11-24 NOTE — Telephone Encounter (Signed)
Routing to dr john----patient states referral for sleep study was supposed to have been set up last week----I cant find any request on patients chart----please advise, thanks

## 2015-11-24 NOTE — Progress Notes (Signed)
Normal results, see office note

## 2015-11-26 NOTE — Assessment & Plan Note (Signed)
Chronic, stable, cont same tx

## 2015-11-26 NOTE — Assessment & Plan Note (Signed)

## 2015-11-26 NOTE — Assessment & Plan Note (Signed)
[facility] for referral pulm,  to f/u any worsening symptoms or concerns

## 2015-11-26 NOTE — Assessment & Plan Note (Addendum)
For UA f/u, declines urology now, but for urology if persists  In addition to the time spent performing CPE, I spent an additional 15 minutes face to face,in which greater than 50% of this time was spent in counseling and coordination of care for patient's illness as documented.

## 2015-12-01 DIAGNOSIS — G40909 Epilepsy, unspecified, not intractable, without status epilepticus: Secondary | ICD-10-CM | POA: Diagnosis not present

## 2015-12-12 ENCOUNTER — Ambulatory Visit: Payer: BLUE CROSS/BLUE SHIELD | Admitting: Internal Medicine

## 2015-12-15 ENCOUNTER — Encounter: Payer: Self-pay | Admitting: Internal Medicine

## 2015-12-15 ENCOUNTER — Ambulatory Visit (INDEPENDENT_AMBULATORY_CARE_PROVIDER_SITE_OTHER): Payer: BLUE CROSS/BLUE SHIELD | Admitting: Internal Medicine

## 2015-12-15 VITALS — BP 118/72 | HR 70 | Wt 206.0 lb

## 2015-12-15 DIAGNOSIS — R569 Unspecified convulsions: Secondary | ICD-10-CM

## 2015-12-15 DIAGNOSIS — G4733 Obstructive sleep apnea (adult) (pediatric): Secondary | ICD-10-CM

## 2015-12-15 NOTE — Patient Instructions (Addendum)
Will send for sleep study; split night in lab sleep study if covered (with history of seizures, this might help with coverage).   Sleep Apnea Sleep apnea is disorder that affects a person's sleep. A person with sleep apnea has abnormal pauses in their breathing when they sleep. It is hard for them to get a good sleep. This makes a person tired during the day. It also can lead to other physical problems. There are three types of sleep apnea. One type is when breathing stops for a short time because your airway is blocked (obstructive sleep apnea). Another type is when the brain sometimes fails to give the normal signal to breathe to the muscles that control your breathing (central sleep apnea). The third type is a combination of the other two types. HOME CARE   Take all medicine as told by your doctor.  Avoid alcohol, calming medicines (sedatives), and depressant drugs.  Try to lose weight if you are overweight. Talk to your doctor about a healthy weight goal.  Your doctor may have you use a device that helps to open your airway. It can help you get the air that you need. It is called a positive airway pressure (PAP) device.   MAKE SURE YOU:   Understand these instructions.  Will watch your condition.  Will get help right away if you are not doing well or get worse.  It may take approximately 1 month for you to get used to wearing her CPAP every night.  Be sure to work with your machine to get used to it, be patient, it may take time!

## 2015-12-15 NOTE — Progress Notes (Signed)
[facility] Consultation      Assessment and Plan:  Excessive daytime sleepiness, with history of obstructive sleep apnea. -We'll send for sleep study. He has met his deductible for the year, therefore, we'll try to get him in for his testing as soon as possible.  Seizure disorder. -Recently diagnosed seizure disorder, possibly epilepsy.  Headache. -Morning headaches, may be related to underlying obstructive sleep apnea.  Allergic rhinitis. -This may affect tolerance of CPAP, we'll consider starting nasal steroid.  GERD. -Sleep apnea can contribute to worsening GERD, continue PPI, and we'll monitor GERD to see if this improves after treatment with CPAP  Date: 12/15/2015  MRN# [id number] Bradley Roberts [birth year]    Bradley Roberts is a 60 y.o. old male seen in consultation for chief complaint of:    Chief Complaint  Patient presents with  . Advice Only    sleep consult per Dr. Jenny Reichmann: dx prev w/mod OSA: snoring; wakes up with HA:    HPI:   The patient was referred for symptoms of excessive daytime sleepiness. He was diagnosed with the moderate obstructive sleep apnea several years ago, he never was set up on CPAP. He typically goes to bed between 10:30 PM and 11 PM. He falls asleep relatively quickly, he gets out of bed at 6 AM. He did not follow-up for his sleep study several years ago. However, more recently, he was diagnosed with a seizure disorders, likely due to epilepsy. Therefore, he is been taking his health or seriously. I would like to follow-up on this sleep apnea issue. He notes symptoms of daytime sleepiness, snoring, his partner notes apneas. He also has history of morning headaches.   PMHX:   Past Medical History:  Diagnosis Date  . ALLERGIC RHINITIS 11/03/2006  . ANXIETY 09/15/2006  . 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 GERD 09/15/2006  . HYPERLIPIDEMIA 09/13/2006  . OBSTRUCTIVE SLEEP APNEA 09/13/2006   test about 2002-mild-mod-did not use cpap-  .  SHOULDER PAIN, RIGHT 11/03/2006  . TESTICULAR MASS 11/03/2009   Surgical Hx:  Past Surgical History:  Procedure Laterality Date  . ARTHROSCOPIC REPAIR ACL  2008   rt  . INGUINAL HERNIA REPAIR Right 06/26/2012   Procedure: HERNIA REPAIR INGUINAL ADULT;  Surgeon: Earnstine Regal, MD;  Location: [facility];  Service: General;  Laterality: Right;  . inguinal herniorrhapy     age 46-6 both rt and lt  . INSERTION OF MESH Right 06/26/2012   Procedure: INSERTION OF MESH;  Surgeon: Earnstine Regal, MD;  Location: [facility];  Service: General;  Laterality: Right;  . RENAL BIOPSY  1979   lt-  . TONSILLECTOMY    . WISDOM TOOTH EXTRACTION     Family Hx:  Family History  Problem Relation Age of Onset  . Cancer Father     lung  . Heart attack Father   . Diabetes Paternal Grandmother   . Heart attack Other   . Heart attack Maternal Grandfather    Social Hx:   Social History  Substance Use Topics  . Smoking status: Never Smoker  . Smokeless tobacco: Never Used  . Alcohol use Yes   Medication:   Reviewed.     Allergies:  Patient has no known allergies.  Review of Systems: Gen:  Denies  fever, sweats, chills HEENT: Denies blurred vision, double vision. bleeds, sore throat Cvc:  No dizziness, chest pain. Resp:   Denies cough or sputum production, shortness of breath Gi: Denies swallowing difficulty, stomach  pain. Gu:  Denies bladder incontinence, burning urine Ext:   No Joint pain, stiffness. Skin: No skin rash,  hives  Endoc:  No polyuria, polydipsia. Psych: No depression, insomnia. Other:  All other systems were reviewed with the patient and were negative other that what is mentioned in the HPI.   Physical Examination:   VS: BP 118/72 (BP Location: Left Arm, Cuff Size: Normal)   Pulse 70   Wt 206 lb (93.4 kg)   SpO2 97%   BMI 27.18 kg/m   General Appearance: No distress  Neuro:without focal findings,  speech normal,  HEENT: PERRLA, EOM intact.     Pulmonary: normal breath sounds, No wheezing.  CardiovascularNormal S1,S2.  No m/r/g.   Abdomen: Benign, Soft, non-tender. Renal:  No costovertebral tenderness  GU:  No performed at this time. Endoc: No evident thyromegaly, no signs of acromegaly. Skin:   warm, no rashes, no ecchymosis  Extremities: normal, no cyanosis, clubbing.  Other findings:    LABORATORY PANEL:   CBC No results for input(s): WBC, HGB, HCT, PLT in the last 168 hours. ------------------------------------------------------------------------------------------------------------------  Chemistries  No results for input(s): NA, K, CL, CO2, GLUCOSE, BUN, CREATININE, CALCIUM, MG, AST, ALT, ALKPHOS, BILITOT in the last 168 hours.  Invalid input(s): GFRCGP ------------------------------------------------------------------------------------------------------------------  Cardiac Enzymes No results for input(s): TROPONINI in the last 168 hours. ------------------------------------------------------------  RADIOLOGY:  No results found.     Thank  you for the consultation and for allowing [facility] to assist in the care of your patient. Our recommendations are noted above.  Please contact us if we can be of further service.   Marda Stalker, MD.  Board Certified in Internal Medicine, Pulmonary Medicine, [place], and Sleep Medicine.  [facility] Office Number: [phone]  Patricia Pesa, M.D.  Vilinda Boehringer, M.D.  Merton Border, M.D  12/15/2015

## 2015-12-18 ENCOUNTER — Telehealth: Payer: Self-pay | Admitting: Internal Medicine

## 2015-12-18 NOTE — Telephone Encounter (Signed)
Rec'd from [facility] forward 11 pages to Dr. Jenny Reichmann

## 2015-12-20 ENCOUNTER — Other Ambulatory Visit: Payer: Self-pay | Admitting: *Deleted

## 2015-12-20 DIAGNOSIS — G4719 Other hypersomnia: Secondary | ICD-10-CM

## 2015-12-26 ENCOUNTER — Encounter: Payer: Self-pay | Admitting: Internal Medicine

## 2015-12-27 DIAGNOSIS — G4733 Obstructive sleep apnea (adult) (pediatric): Secondary | ICD-10-CM | POA: Diagnosis not present

## 2015-12-29 ENCOUNTER — Telehealth: Payer: Self-pay

## 2015-12-29 DIAGNOSIS — G4733 Obstructive sleep apnea (adult) (pediatric): Secondary | ICD-10-CM

## 2015-12-29 NOTE — Telephone Encounter (Signed)
-----   Message from Laverle Hobby, MD sent at 12/28/2015  5:02 PM EST ----- Regarding: HST Positive for OSA. Initiate auto-CPAP with pressure range of 5-20 cm H20.

## 2015-12-29 NOTE — Telephone Encounter (Signed)
Lmtcb. Will await call back 

## 2016-01-02 ENCOUNTER — Other Ambulatory Visit: Payer: Self-pay | Admitting: *Deleted

## 2016-01-02 DIAGNOSIS — G4719 Other hypersomnia: Secondary | ICD-10-CM

## 2016-01-02 NOTE — Telephone Encounter (Signed)
Pt called back; he is aware of results and order placed. Nothing more needed at this time.

## 2016-01-15 DIAGNOSIS — C029 Malignant neoplasm of tongue, unspecified: Secondary | ICD-10-CM

## 2016-01-15 HISTORY — PX: POLYPECTOMY: SHX149

## 2016-01-15 HISTORY — PX: TONGUE SURGERY: SHX810

## 2016-01-15 HISTORY — PX: COLONOSCOPY: SHX174

## 2016-01-15 HISTORY — PX: DEEP NECK LYMPH NODE BIOPSY / EXCISION: SUR126

## 2016-01-15 HISTORY — DX: Malignant neoplasm of tongue, unspecified: C02.9

## 2016-01-23 ENCOUNTER — Ambulatory Visit (AMBULATORY_SURGERY_CENTER): Payer: Self-pay

## 2016-01-23 VITALS — Ht 73.0 in | Wt 209.4 lb

## 2016-01-23 DIAGNOSIS — Z1211 Encounter for screening for malignant neoplasm of colon: Secondary | ICD-10-CM

## 2016-01-23 NOTE — Progress Notes (Signed)
Per pt, no allergies to soy or egg products.Pt not taking any weight loss meds or using  O2 at home. 

## 2016-01-31 ENCOUNTER — Encounter: Payer: Self-pay | Admitting: Internal Medicine

## 2016-02-09 ENCOUNTER — Ambulatory Visit (AMBULATORY_SURGERY_CENTER): Payer: BLUE CROSS/BLUE SHIELD | Admitting: Internal Medicine

## 2016-02-09 ENCOUNTER — Encounter: Payer: Self-pay | Admitting: Internal Medicine

## 2016-02-09 VITALS — BP 129/91 | HR 59 | Temp 98.4°F | Resp 12 | Ht 73.0 in | Wt 209.0 lb

## 2016-02-09 DIAGNOSIS — Z1211 Encounter for screening for malignant neoplasm of colon: Secondary | ICD-10-CM | POA: Diagnosis not present

## 2016-02-09 DIAGNOSIS — D128 Benign neoplasm of rectum: Secondary | ICD-10-CM

## 2016-02-09 DIAGNOSIS — D129 Benign neoplasm of anus and anal canal: Secondary | ICD-10-CM

## 2016-02-09 DIAGNOSIS — Z1212 Encounter for screening for malignant neoplasm of rectum: Secondary | ICD-10-CM | POA: Diagnosis not present

## 2016-02-09 MED ORDER — SODIUM CHLORIDE 0.9 % IV SOLN: 500.0000 mL | INTRAVENOUS | Status: DC

## 2016-02-09 NOTE — Progress Notes (Signed)
Called to room to assist during endoscopic procedure.  Patient ID and intended procedure confirmed with present staff. Received instructions for my participation in the procedure from the performing physician.  

## 2016-02-09 NOTE — Patient Instructions (Addendum)
I found and removed one rectal polyp that looks benign.  I will let you know pathology results and when to have another routine colonoscopy by mail.  I appreciate the opportunity to care for you. Gatha Mayer, MD, FACG YOU HAD AN ENDOSCOPIC PROCEDURE TODAY AT [facility]:   Refer to the procedure report that was given to you for any specific questions about what was found during the examination.  If the procedure report does not answer your questions, please call your gastroenterologist to clarify.  If you requested that your care partner not be given the details of your procedure findings, then the procedure report has been included in a sealed envelope for you to review at your convenience later.  YOU SHOULD EXPECT: Some feelings of bloating in the abdomen. Passage of more gas than usual.  Walking can help get rid of the air that was put into your GI tract during the procedure and reduce the bloating. If you had a lower endoscopy (such as a colonoscopy or flexible sigmoidoscopy) you may notice spotting of blood in your stool or on the toilet paper. If you underwent a bowel prep for your procedure, you may not have a normal bowel movement for a few days.  Please Note:  You might notice some irritation and congestion in your nose or some drainage.  This is from the oxygen used during your procedure.  There is no need for concern and it should clear up in a day or so.  SYMPTOMS TO REPORT IMMEDIATELY:   Following lower endoscopy (colonoscopy or flexible sigmoidoscopy):  Excessive amounts of blood in the stool  Significant tenderness or worsening of abdominal pains  Swelling of the abdomen that is new, acute  Fever of 100F or higher  For urgent or emergent issues, a gastroenterologist can be reached at any hour by calling [phone].   DIET:  We do recommend a small meal at first, but then you may proceed to your regular diet.  Drink plenty of fluids but you  should avoid alcoholic beverages for 24 hours.  ACTIVITY:  You should plan to take it easy for the rest of today and you should NOT DRIVE or use heavy machinery until tomorrow (because of the sedation medicines used during the test).    FOLLOW UP: Our staff will call the number listed on your records the next business day following your procedure to check on you and address any questions or concerns that you may have regarding the information given to you following your procedure. If we do not reach you, we will leave a message.  However, if you are feeling well and you are not experiencing any problems, there is no need to return our call.  We will assume that you have returned to your regular daily activities without incident.  If any biopsies were taken you will be contacted by phone or by letter within the next 1-3 weeks.  Please call us at [phone] if you have not heard about the biopsies in 3 weeks.   Await for biopsy report to determined next repeat Colonoscopy Polyps (handout given) No aspirin , ibuprofen, naproxen, or other non-steriodal ant-inflammatory drugs for 2 weeks after polyp removal. Diverticulosis (handout given)   SIGNATURES/CONFIDENTIALITY: You and/or your care partner have signed paperwork which will be entered into your electronic medical record.  These signatures attest to the fact that that the information above on your After Visit Summary has been reviewed and is  understood.  Full responsibility of the confidentiality of this discharge information lies with you and/or your care-partner. 

## 2016-02-09 NOTE — Op Note (Signed)
[facility] Patient Name: Bradley Roberts Procedure Date: 02/09/2016 8:34 AM MRN: [id number] Endoscopist: Gatha Mayer , MD Age: 61 Referring MD:  Date of Birth: [birth year] Gender: Male Account #: [id number] Procedure:                Colonoscopy Indications:              Screening for colorectal malignant neoplasm, This                            is the patient's first colonoscopy Medicines:                Propofol per Anesthesia, Monitored Anesthesia Care Procedure:                Pre-Anesthesia Assessment:                           - Prior to the procedure, a History and Physical                            was performed, and patient medications and                            allergies were reviewed. The patient's tolerance of                            previous anesthesia was also reviewed. The risks                            and benefits of the procedure and the sedation                            options and risks were discussed with the patient.                            All questions were answered, and informed consent                            was obtained. Prior Anticoagulants: The patient                            last took aspirin 1 day prior to the procedure. ASA                            Grade Assessment: III - A patient with severe                            systemic disease. After reviewing the risks and                            benefits, the patient was deemed in satisfactory                            condition to undergo the procedure.  After obtaining informed consent, the colonoscope                            was passed under direct vision. Throughout the                            procedure, the patient's blood pressure, pulse, and                            oxygen saturations were monitored continuously. The                            Model CF-HQ190L [id number]) scope was introduced                            through the  anus and advanced to the the cecum,                            identified by appendiceal orifice and ileocecal                            valve. The colonoscopy was performed without                            difficulty. The patient tolerated the procedure                            well. The quality of the bowel preparation was                            good. The bowel preparation used was Miralax. The                            ileocecal valve, appendiceal orifice, and rectum                            were photographed. Scope In: 8:39:05 AM Scope Out: 9:01:07 AM Scope Withdrawal Time: 0 hours 17 minutes 32 seconds  Total Procedure Duration: 0 hours 22 minutes 2 seconds  Findings:                 The perianal and digital rectal examinations were                            normal. Pertinent negatives include normal prostate                            (size, shape, and consistency).                           A 12 mm polyp was found in the rectum. The polyp                            was sessile. The polyp was removed with a hot snare                            -  cold snare then tip cautery. Resection and                            retrieval were complete. Verification of patient                            identification for the specimen was done. Estimated                            blood loss was minimal.                           A few large-mouthed diverticula were found in the                            sigmoid colon.                           The exam was otherwise without abnormality on                            direct and retroflexion views. Complications:            No immediate complications. Estimated Blood Loss:     Estimated blood loss was minimal. Impression:               - One 12 mm polyp in the rectum, removed with a hot                            snare. Resected and retrieved.                           - Diverticulosis in the sigmoid colon.                            - The examination was otherwise normal on direct                            and retroflexion views. Recommendation:           - Patient has a contact number available for                            emergencies. The signs and symptoms of potential                            delayed complications were discussed with the                            patient. Return to normal activities tomorrow.                            Written discharge instructions were provided to the                            patient.                           -  Resume previous diet.                           - Continue present medications.                           - Repeat colonoscopy is recommended. The                            colonoscopy date will be determined after pathology                            results from today's exam become available for                            review.                           - No aspirin, ibuprofen, naproxen, or other                            non-steroidal anti-inflammatory drugs for 2 weeks                            after polyp removal. Gatha Mayer, MD 02/09/2016 9:09:49 AM This report has been signed electronically.

## 2016-02-09 NOTE — Progress Notes (Signed)
To recovery vss report to [employer]

## 2016-02-12 ENCOUNTER — Telehealth: Payer: Self-pay

## 2016-02-12 NOTE — Telephone Encounter (Signed)
  Follow up Call-  Call back number 02/09/2016  Post procedure Call Back phone  # [phone]  Permission to leave phone message Yes  Some recent data might be hidden     Patient questions:  Do you have a fever, pain , or abdominal swelling? No. Pain Score  0 *  Have you tolerated food without any problems? Yes.    Have you been able to return to your normal activities? Yes.    Do you have any questions about your discharge instructions: Diet   No. Medications  No. Follow up visit  No.  Do you have questions or concerns about your Care? No.  Actions: * If pain score is 4 or above: No action needed, pain <4.

## 2016-02-13 ENCOUNTER — Encounter: Payer: Self-pay | Admitting: Internal Medicine

## 2016-02-13 DIAGNOSIS — Z86010 Personal history of colonic polyps: Secondary | ICD-10-CM

## 2016-02-13 DIAGNOSIS — Z860101 Personal history of adenomatous and serrated colon polyps: Secondary | ICD-10-CM

## 2016-02-13 HISTORY — DX: Personal history of adenomatous and serrated colon polyps: Z86.0101

## 2016-02-13 HISTORY — DX: Personal history of colonic polyps: Z86.010

## 2016-02-13 NOTE — Progress Notes (Signed)
12 mm adenoma Recall 2021 Letter by My Chart

## 2016-02-19 DIAGNOSIS — G4733 Obstructive sleep apnea (adult) (pediatric): Secondary | ICD-10-CM | POA: Diagnosis not present

## 2016-02-21 DIAGNOSIS — G40009 Localization-related (focal) (partial) idiopathic epilepsy and epileptic syndromes with seizures of localized onset, not intractable, without status epilepticus: Secondary | ICD-10-CM | POA: Diagnosis not present

## 2016-02-21 DIAGNOSIS — Z79899 Other long term (current) drug therapy: Secondary | ICD-10-CM | POA: Diagnosis not present

## 2016-03-06 ENCOUNTER — Ambulatory Visit (INDEPENDENT_AMBULATORY_CARE_PROVIDER_SITE_OTHER): Payer: BLUE CROSS/BLUE SHIELD | Admitting: Internal Medicine

## 2016-03-06 ENCOUNTER — Encounter: Payer: Self-pay | Admitting: Internal Medicine

## 2016-03-06 VITALS — BP 142/90 | HR 72 | Temp 98.4°F | Ht 73.0 in | Wt 211.0 lb

## 2016-03-06 DIAGNOSIS — J22 Unspecified acute lower respiratory infection: Secondary | ICD-10-CM

## 2016-03-06 DIAGNOSIS — R030 Elevated blood-pressure reading, without diagnosis of hypertension: Secondary | ICD-10-CM

## 2016-03-06 MED ORDER — AZITHROMYCIN 250 MG PO TABS: ORAL_TABLET | ORAL | 1 refills | Status: DC

## 2016-03-06 NOTE — Progress Notes (Signed)
Pre visit review using our clinic review tool, if applicable. No additional management support is needed unless otherwise documented below in the visit note. 

## 2016-03-06 NOTE — Progress Notes (Signed)
Subjective:    Patient ID: Bradley Roberts, male    DOB: [birth year], 62 y.o.   MRN: [id number]  HPI   Here with 2-3 days acute onset fever, facial pain, pressure, headache, general weakness and malaise, and greenish d/c, with mild ST and cough, but pt denies chest pain, wheezing, increased sob or doe, orthopnea, PND, increased LE swelling, palpitations, dizziness or syncope. Pt denies new neurological symptoms such as new headache, or facial or extremity weakness or numbness   Pt denies polydipsia, polyuria No other new hx Past Medical History:  Diagnosis Date  . ALLERGIC RHINITIS 11/03/2006  . ANXIETY 09/15/2006  . 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 GERD 09/15/2006  . Hx of adenomatous polyp of rectum 02/13/2016  . HYPERLIPIDEMIA 09/13/2006  . OBSTRUCTIVE SLEEP APNEA 09/13/2006   test about 2002-mild-mod-did not use cpap-  . Seizures ([place])    first seizure was 10/26/2015/ [facility], RIGHT 11/03/2006  . Sleep apnea    on c-pap  . TESTICULAR MASS 11/03/2009   Past Surgical History:  Procedure Laterality Date  . ARTHROSCOPIC REPAIR ACL  2008   rt  . INGUINAL HERNIA REPAIR Right 06/26/2012   Procedure: HERNIA REPAIR INGUINAL ADULT;  Surgeon: Earnstine Regal, MD;  Location: [facility];  Service: General;  Laterality: Right;  . inguinal herniorrhapy     age 35-6 both rt and lt  . INSERTION OF MESH Right 06/26/2012   Procedure: INSERTION OF MESH;  Surgeon: Earnstine Regal, MD;  Location: [facility];  Service: General;  Laterality: Right;  . RENAL BIOPSY  1979   lt-  . TONSILLECTOMY    . WISDOM TOOTH EXTRACTION      reports that he has never smoked. He has never used smokeless tobacco. He reports that he drinks about 1.8 - 2.4 oz of alcohol per week . He reports that he does not use drugs. family history includes Diabetes in his paternal grandmother; Heart attack in his father, maternal grandfather, and other; Liver cancer  in his mother; Lung cancer in his mother. No Known Allergies Current Outpatient Prescriptions on File Prior to Visit  Medication Sig Dispense Refill  . aspirin 81 MG EC tablet Take 81 mg by mouth daily.      Marland Kitchen esomeprazole (NEXIUM) 20 MG capsule Take 20 mg by mouth daily at 12 noon.    . Oxcarbazepine (TRILEPTAL) 300 MG tablet Take 2 tablets by mouth 2 (two) times daily.    . rosuvastatin (CRESTOR) 20 MG tablet Take 1 tablet (20 mg total) by mouth daily. 90 tablet 3   Current Facility-Administered Medications on File Prior to Visit  Medication Dose Route Frequency Provider Last Rate Last Dose  . 0.9 %  sodium chloride infusion  500 mL Intravenous Continuous Gatha Mayer, MD       Review of Systems All otherwise neg per pt     Objective:   Physical Exam BP (!) 142/90 (BP Location: Left Arm, Patient Position: Sitting, Cuff Size: Normal)   Pulse 72   Temp 98.4 F (36.9 C) (Oral)   Ht 6\' 1"  (1.854 m)   Wt 211 lb (95.7 kg)   SpO2 98%   BMI 27.84 kg/m  VS noted, mild ill Constitutional: Pt appears in no apparent distress HENT: Head: NCAT.  Right Ear: External ear normal.  Left Ear: External ear normal.  Eyes: . Pupils are equal, round, and reactive to light.  Conjunctivae and EOM are normal Bilat tm's with mild erythema.  Max sinus areas mild tender.  Pharynx with mild erythema, no exudate Neck: Normal range of motion. Neck supple.  Cardiovascular: Normal rate and regular rhythm.   Pulmonary/Chest: Effort normal and breath sounds without rales or wheezing.  Neurological: Pt is alert. Not confused , motor grossly intact Skin: Skin is warm. No rash, no LE edema Psychiatric: Pt behavior is normal. No agitation.  No other new exam findings    Assessment & Plan:

## 2016-03-06 NOTE — Patient Instructions (Signed)
Please take all new medication as prescribed - the antibiotic  Please continue all other medications as before, and refills have been done if requested.  Please have the pharmacy call with any other refills you may need.  Please continue your efforts at being more active, low cholesterol diet, and weight control.  Please keep your appointments with your specialists as you may have planned    

## 2016-03-10 NOTE — Assessment & Plan Note (Signed)
Mild to mod, for antibx course,  to f/u any worsening symptoms or concerns 

## 2016-03-10 NOTE — Assessment & Plan Note (Signed)
Possibly reactive, asked pt to cont f/u at home BP and next visit

## 2016-03-21 ENCOUNTER — Encounter: Payer: Self-pay | Admitting: Internal Medicine

## 2016-03-21 ENCOUNTER — Ambulatory Visit (INDEPENDENT_AMBULATORY_CARE_PROVIDER_SITE_OTHER): Payer: BLUE CROSS/BLUE SHIELD | Admitting: Internal Medicine

## 2016-03-21 VITALS — BP 138/90 | HR 60 | Temp 97.6°F | Ht 73.0 in | Wt 208.0 lb

## 2016-03-21 DIAGNOSIS — R221 Localized swelling, mass and lump, neck: Secondary | ICD-10-CM | POA: Insufficient documentation

## 2016-03-21 NOTE — Progress Notes (Signed)
Subjective:    Patient ID: Bradley Roberts, male    DOB: 02-22-1955, 61 y.o.   MRN: [id number]  HPI  Here with c/o persistent possibly worsening 3 wks left neck mass near the left submandibular area, firm, NT, without fever ST, HA, cough or other mass. No recent wt loss or reduced appetite. No prior hx of similar mass.  Not better with recent antibx.  Pt denies chest pain, increased sob or doe, wheezing, orthopnea, PND, increased LE swelling, palpitations, dizziness or syncope.  Pt denies new neurological symptoms such as new headache, or facial or extremity weakness or numbness  Pt denies polydipsia, polyuria Past Medical History:  Diagnosis Date  . ALLERGIC RHINITIS 11/03/2006  . ANXIETY 09/15/2006  . 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 GERD 09/15/2006  . Hx of adenomatous polyp of rectum 02/13/2016  . HYPERLIPIDEMIA 09/13/2006  . OBSTRUCTIVE SLEEP APNEA 09/13/2006   test about 2002-mild-mod-did not use cpap-  . Seizures ([place])    first seizure was 10/26/2015/ pt is followed by Dr. Arlyn Leak at [facility], RIGHT 11/03/2006  . Sleep apnea    on c-pap  . TESTICULAR MASS 11/03/2009   Past Surgical History:  Procedure Laterality Date  . ARTHROSCOPIC REPAIR ACL  2008   rt  . INGUINAL HERNIA REPAIR Right 06/26/2012   Procedure: HERNIA REPAIR INGUINAL ADULT;  Surgeon: Earnstine Regal, MD;  Location: [facility];  Service: General;  Laterality: Right;  . inguinal herniorrhapy     age 26-6 both rt and lt  . INSERTION OF MESH Right 06/26/2012   Procedure: INSERTION OF MESH;  Surgeon: Earnstine Regal, MD;  Location: [facility];  Service: General;  Laterality: Right;  . RENAL BIOPSY  1979   lt-  . TONSILLECTOMY    . WISDOM TOOTH EXTRACTION      reports that he has never smoked. He has never used smokeless tobacco. He reports that he drinks about 1.8 - 2.4 oz of alcohol per week . He reports that he does not use drugs. family history includes Diabetes in his  paternal grandmother; Heart attack in his father, maternal grandfather, and other; Liver cancer in his mother; Lung cancer in his mother. No Known Allergies Current Outpatient Prescriptions on File Prior to Visit  Medication Sig Dispense Refill  . aspirin 81 MG EC tablet Take 81 mg by mouth daily.      Marland Kitchen azithromycin (ZITHROMAX Z-PAK) 250 MG tablet 2 tab by mouth day 1, then 1 per day 6 tablet 1  . esomeprazole (NEXIUM) 20 MG capsule Take 20 mg by mouth daily at 12 noon.    . Oxcarbazepine (TRILEPTAL) 300 MG tablet Take 2 tablets by mouth 2 (two) times daily.    . rosuvastatin (CRESTOR) 20 MG tablet Take 1 tablet (20 mg total) by mouth daily. 90 tablet 3   Current Facility-Administered Medications on File Prior to Visit  Medication Dose Route Frequency Provider Last Rate Last Dose  . 0.9 %  sodium chloride infusion  500 mL Intravenous Continuous Gatha Mayer, MD       Review of Systems  Constitutional: Negative for unusual diaphoresis or night sweats HENT: Negative for ear swelling or discharge Eyes: Negative for worsening visual haziness  Respiratory: Negative for choking and stridor.   Gastrointestinal: Negative for distension or worsening eructation Genitourinary: Negative for retention or change in urine volume.  Musculoskeletal: Negative for other MSK pain or swelling Skin: Negative for color change and  worsening wound Neurological: Negative for tremors and numbness other than noted  Psychiatric/Behavioral: Negative for decreased concentration or agitation other than above   No other new exam findings    Objective:   Physical Exam BP 138/90 (BP Location: Left Arm, Patient Position: Sitting, Cuff Size: Normal)   Pulse 60   Temp 97.6 F (36.4 C) (Oral)   Ht 6\' 1"  (1.854 m)   Wt 208 lb (94.3 kg)   SpO2 98%   BMI 27.44 kg/m  VS noted, not ill appearing Constitutional: Pt appears in no apparent distress HENT: Head: NCAT.  Right Ear: External ear normal.  Left Ear: External  ear normal.  Eyes: . Pupils are equal, round, and reactive to light. Conjunctivae and EOM are normal Neck: Normal range of motion. Neck supple. Has one firm fixed mass approx 2cm with mild surrounding swelling to left mid and angle of jaw, no other LA or mass noted to left or right neck Cardiovascular: Normal rate and regular rhythm.   Pulmonary/Chest: Effort normal and breath sounds without rales or wheezing.  Neurological: Pt is alert. Not confused , motor grossly intact Skin: Skin is warm. No rash, no LE edema Psychiatric: Pt behavior is normal. No agitation.  No other exam findings       Assessment & Plan:

## 2016-03-21 NOTE — Progress Notes (Signed)
Pre visit review using our clinic review tool, if applicable. No additional management support is needed unless otherwise documented below in the visit note. 

## 2016-03-21 NOTE — Patient Instructions (Signed)
Please continue all other medications as before, and refills have been done if requested.  Please have the pharmacy call with any other refills you may need.  Please keep your appointments with your specialists as you may have planned  You will be contacted regarding the referral for: CT scan for neck, and ENT (urgent)  Please go to the LAB in the Basement (turn left off the elevator) for the tests to be done at your convenience - for the kidney blood test

## 2016-03-21 NOTE — Assessment & Plan Note (Signed)
Here with I think midly enlarged left neck mass over last visit, not improved with antibx and no other s/s infection at this time, diff includes benign process vs malignancy, will need BMP in am, then CT neck with CM asap, with ENT shortly thereafter,  to f/u any worsening symptoms or concerns

## 2016-03-22 ENCOUNTER — Ambulatory Visit
Admission: RE | Admit: 2016-03-22 | Discharge: 2016-03-22 | Disposition: A | Discharge status: Home / Self Care | Payer: BLUE CROSS/BLUE SHIELD | Source: Ambulatory Visit | Attending: Internal Medicine | Admitting: Internal Medicine

## 2016-03-22 ENCOUNTER — Telehealth: Payer: Self-pay | Admitting: Internal Medicine

## 2016-03-22 ENCOUNTER — Other Ambulatory Visit (INDEPENDENT_AMBULATORY_CARE_PROVIDER_SITE_OTHER): Payer: BLUE CROSS/BLUE SHIELD

## 2016-03-22 ENCOUNTER — Other Ambulatory Visit: Payer: Self-pay | Admitting: Internal Medicine

## 2016-03-22 DIAGNOSIS — R221 Localized swelling, mass and lump, neck: Secondary | ICD-10-CM | POA: Diagnosis not present

## 2016-03-22 LAB — BASIC METABOLIC PANEL
BUN: 19 mg/dL (ref 6–23)
CHLORIDE: 106 meq/L (ref 96–112)
CO2: 29 mEq/L (ref 19–32)
Calcium: 9.3 mg/dL (ref 8.4–10.5)
Creatinine, Ser: 1.18 mg/dL (ref 0.40–1.50)
GFR: 67.11 mL/min (ref >60.00)
Glucose, Bld: 109 mg/dL — ABNORMAL HIGH (ref 70–99)
POTASSIUM: 3.9 meq/L (ref 3.5–5.1)
SODIUM: 143 meq/L (ref 135–145)

## 2016-03-22 MED ORDER — IOPAMIDOL (ISOVUE-300) INJECTION 61%
75.0000 mL | Freq: Once | INTRAVENOUS | Status: AC | PRN
  Administered 2016-03-22: 75 mL via INTRAVENOUS

## 2016-03-22 NOTE — Telephone Encounter (Signed)
See phone note

## 2016-03-23 NOTE — Progress Notes (Deleted)
[facility] Consultation      Assessment and Plan:  Obstructive Sleep apnea.   Seizure disorder. -Recently diagnosed seizure disorder, possibly epilepsy.  Headache. -Morning headaches, may be related to underlying obstructive sleep apnea.  Allergic rhinitis. -This may affect tolerance of CPAP, we'll consider starting nasal steroid.  GERD. -Sleep apnea can contribute to worsening GERD, continue PPI, and we'll monitor GERD to see if this improves after treatment with CPAP  Date: 03/23/2016  MRN# [id number] Bradley Roberts 06/02/1955    Bradley Roberts is a 61 y.o. old male seen in consultation for chief complaint of:    No chief complaint on file.   HPI:   The patient was referred for symptoms of excessive daytime sleepiness. He was diagnosed with the moderate obstructive sleep apnea several years ago, he never was set up on CPAP. He typically goes to bed between 10:30 PM and 11 PM. He falls asleep relatively quickly, he gets out of bed at 6 AM. At last visit he was sent for a sleep study which was positive, therefore started on Auto-PAP.  He did not follow-up for his sleep study several years ago. However, more recently, he was diagnosed with a seizure disorders, likely due to epilepsy. Therefore, he is been taking his health or seriously. I would like to follow-up on this sleep apnea issue. He notes symptoms of daytime sleepiness, snoring, his partner notes apneas. He also has history of morning headaches.  Medication:   Reviewed.     Allergies:  Patient has no known allergies.  Review of Systems: Gen:  Denies  fever, sweats, chills HEENT: Denies blurred vision, double vision. bleeds, sore throat Cvc:  No dizziness, chest pain. Resp:   Denies cough or sputum production, shortness of breath Gi: Denies swallowing difficulty, stomach pain. Gu:  Denies bladder incontinence, burning urine Ext:   No Joint pain, stiffness. Skin: No skin rash,  hives  Endoc:  No  polyuria, polydipsia. Psych: No depression, insomnia. Other:  All other systems were reviewed with the patient and were negative other that what is mentioned in the HPI.   Physical Examination:   VS: There were no vitals taken for this visit.  General Appearance: No distress  Neuro:without focal findings,  speech normal,  HEENT: PERRLA, EOM intact.   Pulmonary: normal breath sounds, No wheezing.  CardiovascularNormal S1,S2.  No m/r/g.   Abdomen: Benign, Soft, non-tender. Renal:  No costovertebral tenderness  GU:  No performed at this time. Endoc: No evident thyromegaly, no signs of acromegaly. Skin:   warm, no rashes, no ecchymosis  Extremities: normal, no cyanosis, clubbing.  Other findings:    LABORATORY PANEL:   CBC No results for input(s): WBC, HGB, HCT, PLT in the last 168 hours. ------------------------------------------------------------------------------------------------------------------  Chemistries   Recent Labs Lab 03/22/16 0841  NA 143  K 3.9  CL 106  CO2 29  GLUCOSE 109*  BUN 19  CREATININE 1.18  CALCIUM 9.3   ------------------------------------------------------------------------------------------------------------------  Cardiac Enzymes No results for input(s): TROPONINI in the last 168 hours. ------------------------------------------------------------  RADIOLOGY:  Ct Soft Tissue Neck W Contrast  Result Date: 03/22/2016 CLINICAL DATA:  Left submandibular mass. EXAM: CT NECK WITH CONTRAST TECHNIQUE: Multidetector CT imaging of the neck was performed using the standard protocol following the bolus administration of intravenous contrast. CONTRAST:  7mL ISOVUE-300 IOPAMIDOL (ISOVUE-300) INJECTION 61% COMPARISON:  None. FINDINGS: Pharynx and larynx: Asymmetric hyperdense tissue is present at the left glossotonsillar sulcus extending into the left vallecula. This is worrisome  for a primary neoplasm. Recommend ENT evaluation. The nasopharynx is within  normal limits. The vocal cords are midline and symmetric Salivary glands: The left submandibular gland is displaced anteriorly by a heterogeneous nodal mass. No intrinsic lesions are present. The right submandibular gland is normal. The parotid glands are within normal limits bilaterally. Thyroid: Negative. Lymph nodes: A heterogeneous anterior left level 2 lymph node measures 3.5 x 2.3 x 2.8 cm. Just inferior are 2 level 3 lymph nodes also concerning for metastatic disease. The more superior node measures 14 x 9 mm. The more inferior node measures 10 x 5 mm. No significant right-sided adenopathy is present. A benign jugulodigastric lymph node is present on the right. Vascular: No focal vascular lesions are present. Limited intracranial: Within normal limits. Visualized orbits: Not imaged Mastoids and visualized paranasal sinuses: The visualized maxillary sinuses, sphenoid sinuses, and mastoid air cells are clear. Skeleton: Anterior and posterior fusion at C2-3 and C3-4 may be congenital. Endplate change in uncovertebral spurring is present C5-6 and C6-7. Upper chest: Mild dependent atelectasis is present in the upper lobes bilaterally. The superior mediastinum is unremarkable. IMPRESSION: 1. 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 2. 2 smaller level 3 lymph nodes are also concerning for metastatic disease. 3. Asymmetric mucosal tissue on the left at the glossotonsillar sulcus and extending into the vallecula raises concern for possible primary site of tumor. Recommend ENT consultation for further evaluation. Electronically Signed   By: San Morelle M.D.   On: 03/22/2016 14:46       Thank  you for the consultation and for allowing [facility] to assist in the care of your patient. Our recommendations are noted above.  Please contact us if we can be of  further service.   Marda Stalker, MD.  Board Certified in Internal Medicine, Pulmonary Medicine, [facility], and Sleep Medicine.  [facility] Office Number: [phone]  Patricia Pesa, M.D.  Vilinda Boehringer, M.D.  Merton Border, M.D  03/23/2016

## 2016-03-25 ENCOUNTER — Ambulatory Visit: Payer: BLUE CROSS/BLUE SHIELD | Admitting: Internal Medicine

## 2016-03-25 ENCOUNTER — Telehealth: Payer: Self-pay | Admitting: Internal Medicine

## 2016-03-25 NOTE — Telephone Encounter (Signed)
Please call patient as he wants to know what his compliance is with his sleep machine for insurance purpose.

## 2016-03-26 ENCOUNTER — Telehealth: Payer: Self-pay | Admitting: Internal Medicine

## 2016-03-26 NOTE — Telephone Encounter (Signed)
FYI: Spoke to pt and he wanted to cancel his appt with Dr. Lucia Gaskins until he can discuss further with you.

## 2016-03-26 NOTE — Telephone Encounter (Signed)
This would be a big mistake and needs to see ENT, I am not able to talk on phone today ; he could consider OV wiuth me but I do not think he needs this  I would not wait on ENT, and would see as planned.  If he wants to be referred elsewhere I can do this but would not be as quick.  Seeing Dr Lucia Gaskins also does not mean he would have to have Dr Lucia Gaskins treatment.  Exam and discussion would be helpful at the least.  Ok to let pt know, and we can try to accomodate his wishes, thanks

## 2016-03-26 NOTE — Addendum Note (Signed)
Addended by: Biagio Borg on: 03/26/2016 04:34 PM   Modules accepted: Orders

## 2016-03-26 NOTE — Telephone Encounter (Signed)
Called pt back for CT scan and ENT copy ready to be pick up

## 2016-03-26 NOTE — Telephone Encounter (Addendum)
Since last message, I have obtained an ENT report from 3/12, pt was already seen by Dr Lucia Gaskins and recommended for tertiary care at [facility] with Dr Vicie Mutters  We will certainly do the referral as this may be a requirement by his insurance, and I note that per Dr Jennell Corner' note, he was going to help facilitate this as well, thanks  Kern to sent pt copy of CT and Dr Lucia Gaskins note

## 2016-03-26 NOTE — Telephone Encounter (Signed)
Spoke with pt and scheduled appt due to cancelling his due to weather. Informed pt at this time he is compliant and to keep it up. Pt verbalized understanding. Nothing further needed.

## 2016-03-26 NOTE — Telephone Encounter (Signed)
Called pt and stated want to get referral somewhere else for ENT to Dr. Caryl Never ([facility])

## 2016-03-27 NOTE — Telephone Encounter (Signed)
Pt scheduled with Dr. Vicie Mutters on 3/16

## 2016-03-27 NOTE — Telephone Encounter (Signed)
Done

## 2016-03-28 DIAGNOSIS — R221 Localized swelling, mass and lump, neck: Secondary | ICD-10-CM | POA: Insufficient documentation

## 2016-04-08 ENCOUNTER — Encounter: Payer: Self-pay | Admitting: Internal Medicine

## 2016-04-08 NOTE — Progress Notes (Signed)
[facility] Consultation      Assessment and Plan:  Obstructive Sleep apnea.  --AHI is 20. OSA is adequately treated at this time.    Seizure disorder. -Recently diagnosed seizure disorder, possibly epilepsy. This appears controlled at this time.   Headache. -Morning headaches, may be related to underlying obstructive sleep apnea.  Allergic rhinitis. -This may affect tolerance of CPAP, we'll consider starting nasal steroid.  GERD. -Continue PPI.   Date: 04/08/2016  MRN# [id number] Bradley Roberts [birth year]    Bradley Roberts is a 61 y.o. old male seen in consultation for chief complaint of:    Chief Complaint  Patient presents with  . Follow-up    CPAP: feels he is doing well: no concerns with machine    HPI:   The patient was referred for symptoms of excessive daytime sleepiness. He was diagnosed with the moderate obstructive sleep apnea several years ago, he never was set up on CPAP. He typically goes to bed between 10:30 PM and 11 PM. He falls asleep relatively quickly, he gets out of bed at 6 AM. At last visit he was sent for a sleep study which was positive, therefore started on Auto-PAP.  He is diagnosed with a seizure disorders, likely due to epilepsy.  He is doing well with CPAP, he has been using it every night, and feels that he is much more awake during the day, he is less sleepy during the day. He is using iat least 6 hours per night, every night.  He notes that headaches have improved since wearing the CPAP.He has not had issues on rhinitis since starting on the pap, he has not had to use any nasal sprays.  He does not notice that there have been any changes in his symptoms since that time.  He has unfortunately been diagnosed with throat cancer and is following with a doctor at [facility].   ** review of download data.3//8 ; average usage is 22 out of 30 days. Auto set is 5-20; 95th percentile pressure is 12 , Maximum pressure is 13. Residual AHI is  2.  **Home sleep study 12/26/15; AHI is 20.   Medication:   Reviewed.     Allergies:  Patient has no known allergies.  Review of Systems: Gen:  Denies  fever, sweats, chills HEENT: Denies blurred vision, double vision. bleeds, sore throat Cvc:  No dizziness, chest pain. Resp:   Denies cough or sputum production, shortness of breath Gi: Denies swallowing difficulty, stomach pain. Gu:  Denies bladder incontinence, burning urine Ext:   No Joint pain, stiffness. Skin: No skin rash,  hives  Endoc:  No polyuria, polydipsia. Psych: No depression, insomnia. Other:  All other systems were reviewed with the patient and were negative other that what is mentioned in the HPI.   Physical Examination:   VS: BP 122/84 (BP Location: Left Arm, Cuff Size: Normal)   Pulse 64   Wt 211 lb (95.7 kg)   SpO2 98%   BMI 27.84 kg/m   General Appearance: No distress  Neuro:without focal findings,  speech normal,  HEENT: PERRLA, EOM intact.  Left neck lymphadenopathy.  Pulmonary: normal breath sounds, No wheezing.  CardiovascularNormal S1,S2.  No m/r/g.   Abdomen: Benign, Soft, non-tender. Renal:  No costovertebral tenderness  GU:  No performed at this time. Endoc: No evident thyromegaly, no signs of acromegaly. Skin:   warm, no rashes, no ecchymosis  Extremities: normal, no cyanosis, clubbing.  Other findings:    LABORATORY PANEL:  CBC No results for input(s): WBC, HGB, HCT, PLT in the last 168 hours. ------------------------------------------------------------------------------------------------------------------  Chemistries  No results for input(s): NA, K, CL, CO2, GLUCOSE, BUN, CREATININE, CALCIUM, MG, AST, ALT, ALKPHOS, BILITOT in the last 168 hours.  Invalid input(s): GFRCGP ------------------------------------------------------------------------------------------------------------------  Cardiac Enzymes No results for input(s): TROPONINI in the last 168  hours. ------------------------------------------------------------  RADIOLOGY:  No results found.     Thank  you for the consultation and for allowing [facility] to assist in the care of your patient. Our recommendations are noted above.  Please contact us if we can be of further service.   Marda Stalker, MD.  Board Certified in Internal Medicine, Pulmonary Medicine, [place], and Sleep Medicine.  [facility] Office Number: [phone]  Patricia Pesa, M.D.  Vilinda Boehringer, M.D.  Merton Border, M.D  04/08/2016

## 2016-04-09 ENCOUNTER — Ambulatory Visit (INDEPENDENT_AMBULATORY_CARE_PROVIDER_SITE_OTHER): Payer: BLUE CROSS/BLUE SHIELD | Admitting: Internal Medicine

## 2016-04-09 ENCOUNTER — Encounter: Payer: Self-pay | Admitting: Internal Medicine

## 2016-04-09 VITALS — BP 122/84 | HR 64 | Wt 211.0 lb

## 2016-04-09 DIAGNOSIS — G4733 Obstructive sleep apnea (adult) (pediatric): Secondary | ICD-10-CM | POA: Diagnosis not present

## 2016-04-09 NOTE — Patient Instructions (Addendum)
--  Doing well with CPAP, continue to use every night.   --Change auto pap range to 9-15.

## 2016-04-09 NOTE — Addendum Note (Signed)
Addended by: Renelda Mom on: 04/09/2016 09:29 AM   Modules accepted: Orders

## 2016-04-16 DIAGNOSIS — C109 Malignant neoplasm of oropharynx, unspecified: Secondary | ICD-10-CM | POA: Insufficient documentation

## 2016-04-23 DIAGNOSIS — C100 Malignant neoplasm of vallecula: Secondary | ICD-10-CM | POA: Diagnosis not present

## 2016-04-26 DIAGNOSIS — C109 Malignant neoplasm of oropharynx, unspecified: Secondary | ICD-10-CM | POA: Diagnosis not present

## 2016-04-26 DIAGNOSIS — Z6827 Body mass index (BMI) 27.0-27.9, adult: Secondary | ICD-10-CM | POA: Diagnosis not present

## 2016-05-07 DIAGNOSIS — C109 Malignant neoplasm of oropharynx, unspecified: Secondary | ICD-10-CM | POA: Diagnosis not present

## 2016-05-07 DIAGNOSIS — Z6828 Body mass index (BMI) 28.0-28.9, adult: Secondary | ICD-10-CM | POA: Diagnosis not present

## 2016-05-07 DIAGNOSIS — K219 Gastro-esophageal reflux disease without esophagitis: Secondary | ICD-10-CM | POA: Diagnosis not present

## 2016-05-07 DIAGNOSIS — Z7982 Long term (current) use of aspirin: Secondary | ICD-10-CM | POA: Diagnosis not present

## 2016-05-07 DIAGNOSIS — Z79899 Other long term (current) drug therapy: Secondary | ICD-10-CM | POA: Diagnosis not present

## 2016-05-07 DIAGNOSIS — K5730 Diverticulosis of large intestine without perforation or abscess without bleeding: Secondary | ICD-10-CM | POA: Diagnosis not present

## 2016-05-08 DIAGNOSIS — Z85810 Personal history of malignant neoplasm of tongue: Secondary | ICD-10-CM | POA: Diagnosis not present

## 2016-05-08 DIAGNOSIS — C109 Malignant neoplasm of oropharynx, unspecified: Secondary | ICD-10-CM | POA: Diagnosis not present

## 2016-05-08 DIAGNOSIS — R918 Other nonspecific abnormal finding of lung field: Secondary | ICD-10-CM | POA: Diagnosis not present

## 2016-05-14 ENCOUNTER — Ambulatory Visit: Payer: BLUE CROSS/BLUE SHIELD | Admitting: Internal Medicine

## 2016-05-14 DIAGNOSIS — J9811 Atelectasis: Secondary | ICD-10-CM | POA: Diagnosis not present

## 2016-05-14 DIAGNOSIS — C100 Malignant neoplasm of vallecula: Secondary | ICD-10-CM | POA: Diagnosis not present

## 2016-05-14 DIAGNOSIS — K5730 Diverticulosis of large intestine without perforation or abscess without bleeding: Secondary | ICD-10-CM | POA: Diagnosis not present

## 2016-05-14 DIAGNOSIS — E785 Hyperlipidemia, unspecified: Secondary | ICD-10-CM | POA: Diagnosis not present

## 2016-05-14 DIAGNOSIS — Z5111 Encounter for antineoplastic chemotherapy: Secondary | ICD-10-CM | POA: Diagnosis not present

## 2016-05-14 DIAGNOSIS — Z006 Encounter for examination for normal comparison and control in clinical research program: Secondary | ICD-10-CM | POA: Diagnosis not present

## 2016-05-14 DIAGNOSIS — R590 Localized enlarged lymph nodes: Secondary | ICD-10-CM | POA: Diagnosis not present

## 2016-05-14 DIAGNOSIS — Z7982 Long term (current) use of aspirin: Secondary | ICD-10-CM | POA: Diagnosis not present

## 2016-05-14 DIAGNOSIS — K219 Gastro-esophageal reflux disease without esophagitis: Secondary | ICD-10-CM | POA: Diagnosis not present

## 2016-05-14 DIAGNOSIS — R911 Solitary pulmonary nodule: Secondary | ICD-10-CM | POA: Diagnosis not present

## 2016-05-14 DIAGNOSIS — Z6828 Body mass index (BMI) 28.0-28.9, adult: Secondary | ICD-10-CM | POA: Diagnosis not present

## 2016-05-14 DIAGNOSIS — G40909 Epilepsy, unspecified, not intractable, without status epilepticus: Secondary | ICD-10-CM | POA: Diagnosis not present

## 2016-05-18 DIAGNOSIS — G4733 Obstructive sleep apnea (adult) (pediatric): Secondary | ICD-10-CM | POA: Diagnosis not present

## 2016-05-21 DIAGNOSIS — Z5111 Encounter for antineoplastic chemotherapy: Secondary | ICD-10-CM | POA: Diagnosis not present

## 2016-05-21 DIAGNOSIS — Z809 Family history of malignant neoplasm, unspecified: Secondary | ICD-10-CM | POA: Diagnosis not present

## 2016-05-21 DIAGNOSIS — Z79899 Other long term (current) drug therapy: Secondary | ICD-10-CM | POA: Diagnosis not present

## 2016-05-21 DIAGNOSIS — K5730 Diverticulosis of large intestine without perforation or abscess without bleeding: Secondary | ICD-10-CM | POA: Diagnosis not present

## 2016-05-21 DIAGNOSIS — J9811 Atelectasis: Secondary | ICD-10-CM | POA: Diagnosis not present

## 2016-05-21 DIAGNOSIS — G40909 Epilepsy, unspecified, not intractable, without status epilepticus: Secondary | ICD-10-CM | POA: Diagnosis not present

## 2016-05-21 DIAGNOSIS — C100 Malignant neoplasm of vallecula: Secondary | ICD-10-CM | POA: Diagnosis not present

## 2016-05-21 DIAGNOSIS — R5383 Other fatigue: Secondary | ICD-10-CM | POA: Diagnosis not present

## 2016-05-21 DIAGNOSIS — Z7982 Long term (current) use of aspirin: Secondary | ICD-10-CM | POA: Diagnosis not present

## 2016-05-21 DIAGNOSIS — E785 Hyperlipidemia, unspecified: Secondary | ICD-10-CM | POA: Diagnosis not present

## 2016-05-21 DIAGNOSIS — K219 Gastro-esophageal reflux disease without esophagitis: Secondary | ICD-10-CM | POA: Diagnosis not present

## 2016-05-21 DIAGNOSIS — R110 Nausea: Secondary | ICD-10-CM | POA: Diagnosis not present

## 2016-05-21 DIAGNOSIS — Z006 Encounter for examination for normal comparison and control in clinical research program: Secondary | ICD-10-CM | POA: Diagnosis not present

## 2016-05-21 DIAGNOSIS — R911 Solitary pulmonary nodule: Secondary | ICD-10-CM | POA: Diagnosis not present

## 2016-05-28 DIAGNOSIS — C100 Malignant neoplasm of vallecula: Secondary | ICD-10-CM | POA: Diagnosis not present

## 2016-05-28 DIAGNOSIS — G40909 Epilepsy, unspecified, not intractable, without status epilepticus: Secondary | ICD-10-CM | POA: Diagnosis not present

## 2016-05-28 DIAGNOSIS — E785 Hyperlipidemia, unspecified: Secondary | ICD-10-CM | POA: Diagnosis not present

## 2016-05-28 DIAGNOSIS — Z6828 Body mass index (BMI) 28.0-28.9, adult: Secondary | ICD-10-CM | POA: Diagnosis not present

## 2016-05-28 DIAGNOSIS — C109 Malignant neoplasm of oropharynx, unspecified: Secondary | ICD-10-CM | POA: Diagnosis not present

## 2016-05-28 DIAGNOSIS — K219 Gastro-esophageal reflux disease without esophagitis: Secondary | ICD-10-CM | POA: Diagnosis not present

## 2016-05-28 DIAGNOSIS — Z809 Family history of malignant neoplasm, unspecified: Secondary | ICD-10-CM | POA: Diagnosis not present

## 2016-05-28 DIAGNOSIS — Z79899 Other long term (current) drug therapy: Secondary | ICD-10-CM | POA: Diagnosis not present

## 2016-05-28 DIAGNOSIS — Z9089 Acquired absence of other organs: Secondary | ICD-10-CM | POA: Diagnosis not present

## 2016-05-28 DIAGNOSIS — Z5111 Encounter for antineoplastic chemotherapy: Secondary | ICD-10-CM | POA: Diagnosis not present

## 2016-06-04 DIAGNOSIS — Z79899 Other long term (current) drug therapy: Secondary | ICD-10-CM | POA: Diagnosis not present

## 2016-06-04 DIAGNOSIS — C100 Malignant neoplasm of vallecula: Secondary | ICD-10-CM | POA: Diagnosis not present

## 2016-06-04 DIAGNOSIS — L659 Nonscarring hair loss, unspecified: Secondary | ICD-10-CM | POA: Diagnosis not present

## 2016-06-04 DIAGNOSIS — R590 Localized enlarged lymph nodes: Secondary | ICD-10-CM | POA: Diagnosis not present

## 2016-06-04 DIAGNOSIS — Z6828 Body mass index (BMI) 28.0-28.9, adult: Secondary | ICD-10-CM | POA: Diagnosis not present

## 2016-06-04 DIAGNOSIS — Z7982 Long term (current) use of aspirin: Secondary | ICD-10-CM | POA: Diagnosis not present

## 2016-06-04 DIAGNOSIS — K5730 Diverticulosis of large intestine without perforation or abscess without bleeding: Secondary | ICD-10-CM | POA: Diagnosis not present

## 2016-06-04 DIAGNOSIS — G40909 Epilepsy, unspecified, not intractable, without status epilepticus: Secondary | ICD-10-CM | POA: Diagnosis not present

## 2016-06-04 DIAGNOSIS — Z809 Family history of malignant neoplasm, unspecified: Secondary | ICD-10-CM | POA: Diagnosis not present

## 2016-06-04 DIAGNOSIS — Z5111 Encounter for antineoplastic chemotherapy: Secondary | ICD-10-CM | POA: Diagnosis not present

## 2016-06-04 DIAGNOSIS — Z7952 Long term (current) use of systemic steroids: Secondary | ICD-10-CM | POA: Diagnosis not present

## 2016-06-04 DIAGNOSIS — Z006 Encounter for examination for normal comparison and control in clinical research program: Secondary | ICD-10-CM | POA: Diagnosis not present

## 2016-06-04 DIAGNOSIS — K219 Gastro-esophageal reflux disease without esophagitis: Secondary | ICD-10-CM | POA: Diagnosis not present

## 2016-06-04 DIAGNOSIS — C109 Malignant neoplasm of oropharynx, unspecified: Secondary | ICD-10-CM | POA: Diagnosis not present

## 2016-06-04 DIAGNOSIS — E785 Hyperlipidemia, unspecified: Secondary | ICD-10-CM | POA: Diagnosis not present

## 2016-06-04 DIAGNOSIS — R911 Solitary pulmonary nodule: Secondary | ICD-10-CM | POA: Diagnosis not present

## 2016-06-04 DIAGNOSIS — J9811 Atelectasis: Secondary | ICD-10-CM | POA: Diagnosis not present

## 2016-06-05 DIAGNOSIS — C100 Malignant neoplasm of vallecula: Secondary | ICD-10-CM | POA: Diagnosis not present

## 2016-06-11 DIAGNOSIS — R911 Solitary pulmonary nodule: Secondary | ICD-10-CM | POA: Diagnosis not present

## 2016-06-11 DIAGNOSIS — L659 Nonscarring hair loss, unspecified: Secondary | ICD-10-CM | POA: Diagnosis not present

## 2016-06-11 DIAGNOSIS — Z6828 Body mass index (BMI) 28.0-28.9, adult: Secondary | ICD-10-CM | POA: Diagnosis not present

## 2016-06-11 DIAGNOSIS — R590 Localized enlarged lymph nodes: Secondary | ICD-10-CM | POA: Diagnosis not present

## 2016-06-11 DIAGNOSIS — Z006 Encounter for examination for normal comparison and control in clinical research program: Secondary | ICD-10-CM | POA: Diagnosis not present

## 2016-06-11 DIAGNOSIS — J9811 Atelectasis: Secondary | ICD-10-CM | POA: Diagnosis not present

## 2016-06-11 DIAGNOSIS — K5730 Diverticulosis of large intestine without perforation or abscess without bleeding: Secondary | ICD-10-CM | POA: Diagnosis not present

## 2016-06-11 DIAGNOSIS — G40909 Epilepsy, unspecified, not intractable, without status epilepticus: Secondary | ICD-10-CM | POA: Diagnosis not present

## 2016-06-11 DIAGNOSIS — K4190 Unilateral femoral hernia, without obstruction or gangrene, not specified as recurrent: Secondary | ICD-10-CM | POA: Diagnosis not present

## 2016-06-11 DIAGNOSIS — Z79899 Other long term (current) drug therapy: Secondary | ICD-10-CM | POA: Diagnosis not present

## 2016-06-11 DIAGNOSIS — R110 Nausea: Secondary | ICD-10-CM | POA: Diagnosis not present

## 2016-06-11 DIAGNOSIS — K219 Gastro-esophageal reflux disease without esophagitis: Secondary | ICD-10-CM | POA: Diagnosis not present

## 2016-06-11 DIAGNOSIS — K5900 Constipation, unspecified: Secondary | ICD-10-CM | POA: Diagnosis not present

## 2016-06-11 DIAGNOSIS — C100 Malignant neoplasm of vallecula: Secondary | ICD-10-CM | POA: Diagnosis not present

## 2016-06-11 DIAGNOSIS — Z5111 Encounter for antineoplastic chemotherapy: Secondary | ICD-10-CM | POA: Diagnosis not present

## 2016-06-11 DIAGNOSIS — C109 Malignant neoplasm of oropharynx, unspecified: Secondary | ICD-10-CM | POA: Diagnosis not present

## 2016-06-11 DIAGNOSIS — E785 Hyperlipidemia, unspecified: Secondary | ICD-10-CM | POA: Diagnosis not present

## 2016-06-18 DIAGNOSIS — D709 Neutropenia, unspecified: Secondary | ICD-10-CM | POA: Diagnosis not present

## 2016-06-18 DIAGNOSIS — Z006 Encounter for examination for normal comparison and control in clinical research program: Secondary | ICD-10-CM | POA: Diagnosis not present

## 2016-06-18 DIAGNOSIS — R110 Nausea: Secondary | ICD-10-CM | POA: Diagnosis not present

## 2016-06-18 DIAGNOSIS — C109 Malignant neoplasm of oropharynx, unspecified: Secondary | ICD-10-CM | POA: Diagnosis not present

## 2016-06-18 DIAGNOSIS — K219 Gastro-esophageal reflux disease without esophagitis: Secondary | ICD-10-CM | POA: Diagnosis not present

## 2016-06-18 DIAGNOSIS — L659 Nonscarring hair loss, unspecified: Secondary | ICD-10-CM | POA: Diagnosis not present

## 2016-06-18 DIAGNOSIS — R569 Unspecified convulsions: Secondary | ICD-10-CM | POA: Diagnosis not present

## 2016-06-18 DIAGNOSIS — C100 Malignant neoplasm of vallecula: Secondary | ICD-10-CM | POA: Diagnosis not present

## 2016-06-18 DIAGNOSIS — R5381 Other malaise: Secondary | ICD-10-CM | POA: Diagnosis not present

## 2016-06-18 DIAGNOSIS — Z5111 Encounter for antineoplastic chemotherapy: Secondary | ICD-10-CM | POA: Diagnosis not present

## 2016-06-18 DIAGNOSIS — Z6828 Body mass index (BMI) 28.0-28.9, adult: Secondary | ICD-10-CM | POA: Diagnosis not present

## 2016-06-18 DIAGNOSIS — K5900 Constipation, unspecified: Secondary | ICD-10-CM | POA: Diagnosis not present

## 2016-06-25 DIAGNOSIS — Z79899 Other long term (current) drug therapy: Secondary | ICD-10-CM | POA: Diagnosis not present

## 2016-06-25 DIAGNOSIS — R110 Nausea: Secondary | ICD-10-CM | POA: Diagnosis not present

## 2016-06-25 DIAGNOSIS — K219 Gastro-esophageal reflux disease without esophagitis: Secondary | ICD-10-CM | POA: Diagnosis not present

## 2016-06-25 DIAGNOSIS — R5383 Other fatigue: Secondary | ICD-10-CM | POA: Diagnosis not present

## 2016-06-25 DIAGNOSIS — G40909 Epilepsy, unspecified, not intractable, without status epilepticus: Secondary | ICD-10-CM | POA: Diagnosis not present

## 2016-06-25 DIAGNOSIS — Z5112 Encounter for antineoplastic immunotherapy: Secondary | ICD-10-CM | POA: Diagnosis not present

## 2016-06-25 DIAGNOSIS — Z6828 Body mass index (BMI) 28.0-28.9, adult: Secondary | ICD-10-CM | POA: Diagnosis not present

## 2016-06-25 DIAGNOSIS — C100 Malignant neoplasm of vallecula: Secondary | ICD-10-CM | POA: Diagnosis not present

## 2016-06-25 DIAGNOSIS — L659 Nonscarring hair loss, unspecified: Secondary | ICD-10-CM | POA: Diagnosis not present

## 2016-06-25 DIAGNOSIS — C109 Malignant neoplasm of oropharynx, unspecified: Secondary | ICD-10-CM | POA: Diagnosis not present

## 2016-06-25 DIAGNOSIS — Z006 Encounter for examination for normal comparison and control in clinical research program: Secondary | ICD-10-CM | POA: Diagnosis not present

## 2016-07-08 DIAGNOSIS — G4733 Obstructive sleep apnea (adult) (pediatric): Secondary | ICD-10-CM | POA: Diagnosis not present

## 2016-07-09 DIAGNOSIS — Z5112 Encounter for antineoplastic immunotherapy: Secondary | ICD-10-CM | POA: Diagnosis not present

## 2016-07-09 DIAGNOSIS — Z6828 Body mass index (BMI) 28.0-28.9, adult: Secondary | ICD-10-CM | POA: Diagnosis not present

## 2016-07-09 DIAGNOSIS — C100 Malignant neoplasm of vallecula: Secondary | ICD-10-CM | POA: Diagnosis not present

## 2016-07-09 DIAGNOSIS — Z006 Encounter for examination for normal comparison and control in clinical research program: Secondary | ICD-10-CM | POA: Diagnosis not present

## 2016-07-09 DIAGNOSIS — C109 Malignant neoplasm of oropharynx, unspecified: Secondary | ICD-10-CM | POA: Diagnosis not present

## 2016-07-11 DIAGNOSIS — C109 Malignant neoplasm of oropharynx, unspecified: Secondary | ICD-10-CM | POA: Diagnosis not present

## 2016-07-22 DIAGNOSIS — Z7982 Long term (current) use of aspirin: Secondary | ICD-10-CM | POA: Diagnosis not present

## 2016-07-22 DIAGNOSIS — C109 Malignant neoplasm of oropharynx, unspecified: Secondary | ICD-10-CM | POA: Diagnosis not present

## 2016-07-22 DIAGNOSIS — G4730 Sleep apnea, unspecified: Secondary | ICD-10-CM | POA: Diagnosis not present

## 2016-07-22 DIAGNOSIS — R569 Unspecified convulsions: Secondary | ICD-10-CM | POA: Diagnosis not present

## 2016-07-22 DIAGNOSIS — R590 Localized enlarged lymph nodes: Secondary | ICD-10-CM | POA: Diagnosis not present

## 2016-07-22 DIAGNOSIS — C01 Malignant neoplasm of base of tongue: Secondary | ICD-10-CM | POA: Diagnosis not present

## 2016-07-22 DIAGNOSIS — K219 Gastro-esophageal reflux disease without esophagitis: Secondary | ICD-10-CM | POA: Diagnosis not present

## 2016-07-22 DIAGNOSIS — D3702 Neoplasm of uncertain behavior of tongue: Secondary | ICD-10-CM | POA: Diagnosis not present

## 2016-07-23 DIAGNOSIS — K219 Gastro-esophageal reflux disease without esophagitis: Secondary | ICD-10-CM | POA: Diagnosis not present

## 2016-07-23 DIAGNOSIS — C109 Malignant neoplasm of oropharynx, unspecified: Secondary | ICD-10-CM | POA: Diagnosis not present

## 2016-07-23 DIAGNOSIS — R569 Unspecified convulsions: Secondary | ICD-10-CM | POA: Diagnosis not present

## 2016-07-23 DIAGNOSIS — Z7982 Long term (current) use of aspirin: Secondary | ICD-10-CM | POA: Diagnosis not present

## 2016-07-23 DIAGNOSIS — G4730 Sleep apnea, unspecified: Secondary | ICD-10-CM | POA: Diagnosis not present

## 2016-07-23 DIAGNOSIS — C01 Malignant neoplasm of base of tongue: Secondary | ICD-10-CM | POA: Diagnosis not present

## 2016-07-24 DIAGNOSIS — K219 Gastro-esophageal reflux disease without esophagitis: Secondary | ICD-10-CM | POA: Diagnosis not present

## 2016-07-24 DIAGNOSIS — C109 Malignant neoplasm of oropharynx, unspecified: Secondary | ICD-10-CM | POA: Diagnosis not present

## 2016-07-24 DIAGNOSIS — G4730 Sleep apnea, unspecified: Secondary | ICD-10-CM | POA: Diagnosis not present

## 2016-07-24 DIAGNOSIS — C01 Malignant neoplasm of base of tongue: Secondary | ICD-10-CM | POA: Diagnosis not present

## 2016-07-24 DIAGNOSIS — R569 Unspecified convulsions: Secondary | ICD-10-CM | POA: Diagnosis not present

## 2016-07-24 DIAGNOSIS — Z7982 Long term (current) use of aspirin: Secondary | ICD-10-CM | POA: Diagnosis not present

## 2016-08-20 DIAGNOSIS — Z5112 Encounter for antineoplastic immunotherapy: Secondary | ICD-10-CM | POA: Diagnosis not present

## 2016-08-20 DIAGNOSIS — Z9089 Acquired absence of other organs: Secondary | ICD-10-CM | POA: Diagnosis not present

## 2016-08-20 DIAGNOSIS — R29898 Other symptoms and signs involving the musculoskeletal system: Secondary | ICD-10-CM | POA: Diagnosis not present

## 2016-08-20 DIAGNOSIS — M25511 Pain in right shoulder: Secondary | ICD-10-CM | POA: Diagnosis not present

## 2016-08-20 DIAGNOSIS — M25512 Pain in left shoulder: Secondary | ICD-10-CM | POA: Diagnosis not present

## 2016-08-20 DIAGNOSIS — M549 Dorsalgia, unspecified: Secondary | ICD-10-CM | POA: Diagnosis not present

## 2016-08-20 DIAGNOSIS — Z7982 Long term (current) use of aspirin: Secondary | ICD-10-CM | POA: Diagnosis not present

## 2016-08-20 DIAGNOSIS — G40909 Epilepsy, unspecified, not intractable, without status epilepticus: Secondary | ICD-10-CM | POA: Diagnosis not present

## 2016-08-20 DIAGNOSIS — C100 Malignant neoplasm of vallecula: Secondary | ICD-10-CM | POA: Diagnosis not present

## 2016-08-20 DIAGNOSIS — Z006 Encounter for examination for normal comparison and control in clinical research program: Secondary | ICD-10-CM | POA: Diagnosis not present

## 2016-08-20 DIAGNOSIS — Z809 Family history of malignant neoplasm, unspecified: Secondary | ICD-10-CM | POA: Diagnosis not present

## 2016-08-20 DIAGNOSIS — Z79899 Other long term (current) drug therapy: Secondary | ICD-10-CM | POA: Diagnosis not present

## 2016-09-03 DIAGNOSIS — G40909 Epilepsy, unspecified, not intractable, without status epilepticus: Secondary | ICD-10-CM | POA: Diagnosis not present

## 2016-09-03 DIAGNOSIS — Z6826 Body mass index (BMI) 26.0-26.9, adult: Secondary | ICD-10-CM | POA: Diagnosis not present

## 2016-09-03 DIAGNOSIS — Z79899 Other long term (current) drug therapy: Secondary | ICD-10-CM | POA: Diagnosis not present

## 2016-09-03 DIAGNOSIS — Z7982 Long term (current) use of aspirin: Secondary | ICD-10-CM | POA: Diagnosis not present

## 2016-09-03 DIAGNOSIS — C100 Malignant neoplasm of vallecula: Secondary | ICD-10-CM | POA: Diagnosis not present

## 2016-09-03 DIAGNOSIS — Z006 Encounter for examination for normal comparison and control in clinical research program: Secondary | ICD-10-CM | POA: Diagnosis not present

## 2016-09-17 DIAGNOSIS — Z809 Family history of malignant neoplasm, unspecified: Secondary | ICD-10-CM | POA: Diagnosis not present

## 2016-09-17 DIAGNOSIS — C100 Malignant neoplasm of vallecula: Secondary | ICD-10-CM | POA: Diagnosis not present

## 2016-09-17 DIAGNOSIS — Z9889 Other specified postprocedural states: Secondary | ICD-10-CM | POA: Diagnosis not present

## 2016-09-17 DIAGNOSIS — G40909 Epilepsy, unspecified, not intractable, without status epilepticus: Secondary | ICD-10-CM | POA: Diagnosis not present

## 2016-09-17 DIAGNOSIS — Z006 Encounter for examination for normal comparison and control in clinical research program: Secondary | ICD-10-CM | POA: Diagnosis not present

## 2016-09-17 DIAGNOSIS — Z79899 Other long term (current) drug therapy: Secondary | ICD-10-CM | POA: Diagnosis not present

## 2016-09-17 DIAGNOSIS — K219 Gastro-esophageal reflux disease without esophagitis: Secondary | ICD-10-CM | POA: Diagnosis not present

## 2016-09-17 DIAGNOSIS — Z7982 Long term (current) use of aspirin: Secondary | ICD-10-CM | POA: Diagnosis not present

## 2016-09-17 DIAGNOSIS — Z5112 Encounter for antineoplastic immunotherapy: Secondary | ICD-10-CM | POA: Diagnosis not present

## 2016-09-17 DIAGNOSIS — Z5111 Encounter for antineoplastic chemotherapy: Secondary | ICD-10-CM | POA: Diagnosis not present

## 2016-09-17 DIAGNOSIS — Z6826 Body mass index (BMI) 26.0-26.9, adult: Secondary | ICD-10-CM | POA: Diagnosis not present

## 2016-09-23 DIAGNOSIS — H40013 Open angle with borderline findings, low risk, bilateral: Secondary | ICD-10-CM | POA: Diagnosis not present

## 2016-10-30 DIAGNOSIS — Z6827 Body mass index (BMI) 27.0-27.9, adult: Secondary | ICD-10-CM | POA: Diagnosis not present

## 2016-10-30 DIAGNOSIS — C01 Malignant neoplasm of base of tongue: Secondary | ICD-10-CM | POA: Diagnosis not present

## 2016-11-18 DIAGNOSIS — E785 Hyperlipidemia, unspecified: Secondary | ICD-10-CM | POA: Diagnosis not present

## 2016-11-18 DIAGNOSIS — K219 Gastro-esophageal reflux disease without esophagitis: Secondary | ICD-10-CM | POA: Diagnosis not present

## 2016-11-18 DIAGNOSIS — G40909 Epilepsy, unspecified, not intractable, without status epilepticus: Secondary | ICD-10-CM | POA: Diagnosis not present

## 2016-11-18 DIAGNOSIS — R200 Anesthesia of skin: Secondary | ICD-10-CM | POA: Diagnosis not present

## 2016-11-18 DIAGNOSIS — Z6827 Body mass index (BMI) 27.0-27.9, adult: Secondary | ICD-10-CM | POA: Diagnosis not present

## 2016-11-18 DIAGNOSIS — Z006 Encounter for examination for normal comparison and control in clinical research program: Secondary | ICD-10-CM | POA: Diagnosis not present

## 2016-11-18 DIAGNOSIS — C090 Malignant neoplasm of tonsillar fossa: Secondary | ICD-10-CM | POA: Diagnosis not present

## 2016-11-18 DIAGNOSIS — C109 Malignant neoplasm of oropharynx, unspecified: Secondary | ICD-10-CM | POA: Diagnosis not present

## 2016-12-02 ENCOUNTER — Telehealth: Payer: Self-pay | Admitting: Internal Medicine

## 2016-12-02 MED ORDER — ROSUVASTATIN CALCIUM 20 MG PO TABS: 20.0000 mg | ORAL_TABLET | Freq: Every day | ORAL | 0 refills | Status: DC

## 2016-12-02 NOTE — Telephone Encounter (Signed)
Pt called for a refill of his  rosuvastatin (CRESTOR) 20 MG tablet  States his RX exprired Please advise and send to pharmacy on file

## 2016-12-02 NOTE — Telephone Encounter (Signed)
Pt is due for annual appt sent 30 day script must see provider for refills...Bradley Roberts

## 2017-01-03 ENCOUNTER — Other Ambulatory Visit: Payer: Self-pay | Admitting: Internal Medicine

## 2017-02-03 ENCOUNTER — Telehealth: Payer: Self-pay | Admitting: Internal Medicine

## 2017-02-04 ENCOUNTER — Ambulatory Visit: Payer: BLUE CROSS/BLUE SHIELD | Admitting: Family

## 2017-02-04 ENCOUNTER — Encounter: Payer: Self-pay | Admitting: Family

## 2017-02-04 VITALS — BP 132/82 | HR 80 | Temp 98.5°F | Resp 16 | Ht 73.0 in | Wt 206.0 lb

## 2017-02-04 DIAGNOSIS — C01 Malignant neoplasm of base of tongue: Secondary | ICD-10-CM | POA: Diagnosis not present

## 2017-02-04 DIAGNOSIS — E782 Mixed hyperlipidemia: Secondary | ICD-10-CM | POA: Diagnosis not present

## 2017-02-04 DIAGNOSIS — J069 Acute upper respiratory infection, unspecified: Secondary | ICD-10-CM

## 2017-02-04 DIAGNOSIS — Z6827 Body mass index (BMI) 27.0-27.9, adult: Secondary | ICD-10-CM | POA: Diagnosis not present

## 2017-02-04 MED ORDER — ROSUVASTATIN CALCIUM 20 MG PO TABS: ORAL_TABLET | ORAL | 1 refills | Status: DC

## 2017-02-04 NOTE — Telephone Encounter (Signed)
Per office policy sent enough refills to local pharmacy until appt.../lmb  

## 2017-02-04 NOTE — Progress Notes (Signed)
Bradley Roberts is a 62 y.o. male with the following history as recorded in EpicCare:  Patient Active Problem List   Diagnosis Date Noted  . Mass of left side of neck 03/21/2016  . Hx of adenomatous polyp of rectum 02/13/2016  . Low back pain 11/21/2015  . Gross hematuria 11/21/2015  . Seizure ([place]) 10/30/2015  . Rash 06/14/2015  . Elevated blood pressure reading without diagnosis of hypertension 08/26/2014  . Right inguinal hernia 05/12/2012  . Eyelid lesion 05/12/2012  . Encounter for well adult exam with abnormal findings 05/12/2010  . OSA (obstructive sleep apnea) 09/13/2006    Current Outpatient Medications  Medication Sig Dispense Refill  . aspirin 81 MG EC tablet Take 81 mg by mouth daily.      Marland Kitchen esomeprazole (NEXIUM) 20 MG capsule Take 20 mg by mouth daily at 12 noon.    . Oxcarbazepine (TRILEPTAL) 300 MG tablet Take 2 tablets by mouth 2 (two) times daily.    . Oxcarbazepine (TRILEPTAL) 300 MG tablet Take 300 mg by mouth 2 (two) times daily.    . rosuvastatin (CRESTOR) 20 MG tablet Take 1 tablet by mouth daily. Must keep scheduled appt in March for refills 30 tablet 1   Current Facility-Administered Medications  Medication Dose Route Frequency Provider Last Rate Last Dose  . 0.9 %  sodium chloride infusion  500 mL Intravenous Continuous Gatha Mayer, MD        Allergies: Patient has no known allergies.  Past Medical History:  Diagnosis Date  . ALLERGIC RHINITIS 11/03/2006  . ANXIETY 09/15/2006  . 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 GERD 09/15/2006  . Hx of adenomatous polyp of rectum 02/13/2016  . HYPERLIPIDEMIA 09/13/2006  . OBSTRUCTIVE SLEEP APNEA 09/13/2006   test about 2002-mild-mod-did not use cpap-  . Seizures ([place])    first seizure was 10/26/2015/ pt is followed by Dr. Arlyn Leak at [facility], RIGHT 11/03/2006  . Sleep apnea    on c-pap  . TESTICULAR MASS 11/03/2009    Past Surgical History:  Procedure Laterality Date  . ARTHROSCOPIC REPAIR ACL   2008   rt  . INGUINAL HERNIA REPAIR Right 06/26/2012   Procedure: HERNIA REPAIR INGUINAL ADULT;  Surgeon: Earnstine Regal, MD;  Location: [facility];  Service: General;  Laterality: Right;  . inguinal herniorrhapy     age 69-6 both rt and lt  . INSERTION OF MESH Right 06/26/2012   Procedure: INSERTION OF MESH;  Surgeon: Earnstine Regal, MD;  Location: [facility];  Service: General;  Laterality: Right;  . RENAL BIOPSY  1979   lt-  . TONSILLECTOMY    . WISDOM TOOTH EXTRACTION      Family History  Problem Relation Age of Onset  . Lung cancer Mother   . Liver cancer Mother   . Heart attack Father   . Diabetes Paternal Grandmother   . Heart attack Other   . Heart attack Maternal Grandfather   . Colon cancer Neg Hx   . Colon polyps Neg Hx   . Esophageal cancer Neg Hx   . Rectal cancer Neg Hx   . Stomach cancer Neg Hx     Social History   Tobacco Use  . Smoking status: Never Smoker  . Smokeless tobacco: Never Used  Substance Use Topics  . Alcohol use: Yes    Alcohol/week: 1.8 - 2.4 oz    Types: 1 - 2 Standard drinks or equivalent, 2 Cans of beer  per week    Subjective:  Cold symptoms x 2 days; just returned from [place] and symptoms started upon return; last took Tylenol this am- no known fever today; no chest pain, no shortness of breath; started with headache- has now resolved; no body aches; just wanted to be sure that don't need to start antibiotic since traveling tomorrow;   Objective:  Vitals:   02/04/17 1529  BP: 132/82  Pulse: 80  Resp: 16  Temp: 98.5 F (36.9 C)  TempSrc: Oral  SpO2: 98%  Weight: 206 lb (93.4 kg)  Height: 6\' 1"  (1.854 m)    General: Well developed, well nourished, in no acute distress  Skin : Warm and dry.  Head: Normocephalic and atraumatic  Eyes: Sclera and conjunctiva clear; pupils round and reactive to light; extraocular movements intact  Ears: External normal; canals clear; tympanic membranes normal   Oropharynx: Pink, supple. No suspicious lesions  Neck: Supple without thyromegaly, adenopathy  Lungs: Respirations unlabored; clear to auscultation bilaterally without wheeze, rales, rhonchi  CVS exam: normal rate and regular rhythm.  Neurologic: Alert and oriented; speech intact; face symmetrical; moves all extremities well; CNII-XII intact without focal deficit  Assessment:  1. Viral URI   2. Mixed hyperlipidemia     Plan:  Reassurance; symptoms appear to be resolving; symptomatic treatment discussed; increase fluids, rest and follow-up worse, no better.   Refill given on Crestor until OV with his PCP in March 2019;   No Follow-up on file.  No orders of the defined types were placed in this encounter.   Requested Prescriptions   Signed Prescriptions Disp Refills  . rosuvastatin (CRESTOR) 20 MG tablet 30 tablet 1    Sig: Take 1 tablet by mouth daily. Must keep scheduled appt in March for refills

## 2017-02-04 NOTE — Telephone Encounter (Signed)
Patient is scheduled for 03/26/17 for physical. Can meds be filled? Please advise. Call back [phone]

## 2017-02-04 NOTE — Addendum Note (Signed)
Addended by: Earnstine Regal on: 02/04/2017 11:06 AM   Modules accepted: Orders

## 2017-02-19 ENCOUNTER — Encounter: Payer: Self-pay | Admitting: Nurse Practitioner

## 2017-02-19 ENCOUNTER — Ambulatory Visit: Payer: BLUE CROSS/BLUE SHIELD | Admitting: Nurse Practitioner

## 2017-02-19 VITALS — BP 122/80 | HR 74 | Temp 98.5°F | Resp 16 | Ht 73.0 in | Wt 199.0 lb

## 2017-02-19 DIAGNOSIS — J069 Acute upper respiratory infection, unspecified: Secondary | ICD-10-CM

## 2017-02-19 NOTE — Progress Notes (Signed)
Name: Bradley Roberts   MRN: [id number]    DOB: 19-Oct-1955   Date:02/19/2017       Progress Note  Subjective  Chief Complaint  Chief Complaint  Patient presents with  . Cough    congestion, cough over the last 2 days    HPI  This is an acute problem. This problem began about 2 days ago when he developed fatigue, hoarseness and nonproductive cough. He was seen here in our clinic on 1/22 for a viral URI but his symptoms did resolve a few days after 1/22. He denies fevers, headache, sinus congestion, sore throat, chest pain, shortness of breath, nausea, vomiting, rash. He has tried tried delsym without relief of cough. 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 He admits that he is likes to be very cautious of his health due to this history. 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   Patient Active Problem List   Diagnosis Date Noted  . Mass of left side of neck 03/21/2016  . Hx of adenomatous polyp of rectum 02/13/2016  . Low back pain 11/21/2015  . Gross hematuria 11/21/2015  . Seizure ([place]) 10/30/2015  . Rash 06/14/2015  . Elevated blood pressure reading without diagnosis of hypertension 08/26/2014  . Right inguinal hernia 05/12/2012  . Eyelid lesion 05/12/2012  . Encounter for well adult exam with abnormal findings 05/12/2010  . OSA (obstructive sleep apnea) 09/13/2006    Social History   Tobacco Use  . Smoking status: Never Smoker  . Smokeless tobacco: Never Used  Substance Use Topics  . Alcohol use: Yes    Alcohol/week: 1.8 - 2.4 oz    Types: 1 - 2 Standard drinks or equivalent, 2 Cans of beer per week     Current Outpatient Medications:  .  aspirin 81 MG EC tablet, Take 81 mg by mouth daily.  , Disp: , Rfl:  .  esomeprazole (NEXIUM) 20 MG capsule, Take 20 mg by mouth daily at 12 noon., Disp: , Rfl:  .  Oxcarbazepine (TRILEPTAL) 300 MG tablet, Take 300 mg by mouth 2  (two) times daily., Disp: , Rfl:  .  rosuvastatin (CRESTOR) 20 MG tablet, Take 1 tablet by mouth daily. Must keep scheduled appt in March for refills, Disp: 30 tablet, Rfl: 1 .  Oxcarbazepine (TRILEPTAL) 300 MG tablet, Take 2 tablets by mouth 2 (two) times daily., Disp: , Rfl:   Current Facility-Administered Medications:  .  0.9 %  sodium chloride infusion, 500 mL, Intravenous, Continuous, Gatha Mayer, MD  No Known Allergies  ROS See HPI  Objective  Vitals:   02/19/17 1355  BP: 122/80  Pulse: 74  Resp: 16  Temp: 98.5 F (36.9 C)  TempSrc: Oral  SpO2: 95%  Weight: 199 lb (90.3 kg)  Height: 6\' 1"  (1.854 m)   Body mass index is 26.25 kg/m.  Nursing Note and Vital Signs reviewed.  Physical Exam Vital signs reviewed. Constitutional: Patient appears well-developed and well-nourished.  No distress.  HEENT: head atraumatic, normocephalic, pupils equal and reactive to light, EOM's intact, TM's without erythema or bulging, no maxillary or frontal sinus tenderness , neck supple without lymphadenopathy, oropharynx pink and moist without exudate Cardiovascular: Normal rate, regular rhythm, S1/S2 present.  No murmur or rub heard. No BLE edema. Pulmonary/Chest: Effort normal and breath sounds clear. No respiratory distress or retractions. Abdominal: Soft and non-tender.  Psychiatric: Patient has a normal  mood and affect. behavior is normal. Judgment and thought content normal.  Assessment & Plan  1. Upper respiratory tract infection, unspecified type Likely viral, day 2 of symptoms today His physical examination and vital signs are normal. We discussed URI care at home. Rred flags and when to present for emergency care or RTC including fever >101.69F, chest pain, shortness of breath, new/worsening/un-resolving symptoms were reviewed with patient at time of visit. See AVS for education provided to patient

## 2017-02-19 NOTE — Patient Instructions (Addendum)
For nasal and head congestion may take Sudafed PE 10 mg every 4 hour as needed. Saline nasal spray used frequently. For drainage may use Allegra, Claritin or Zyrtec. If you need stronger medicine to stop drainage may take Chlor-Trimeton 2-4 mg every 4 hours. This may cause drowsiness. Ibuprofen 600 mg every 6 hours as needed for pain, discomfort or fever. Drink plenty of fluids and stay well-hydrated. Please let us know if you are not feeling better in 1 week.  It was nice to meet you. Thanks for letting me take care of you today :)   Upper Respiratory Infection, Adult Most upper respiratory infections (URIs) are caused by a virus. A URI affects the nose, throat, and upper air passages. The most common type of URI is often called "the common cold." Follow these instructions at home:  Take medicines only as told by your doctor.  Gargle warm saltwater or take cough drops to comfort your throat as told by your doctor.  Use a warm mist humidifier or inhale steam from a shower to increase air moisture. This may make it easier to breathe.  Drink enough fluid to keep your pee (urine) clear or pale yellow.  Eat soups and other clear broths.  Have a healthy diet.  Rest as needed.  Go back to work when your fever is gone or your doctor says it is okay. ? You may need to stay home longer to avoid giving your URI to others. ? You can also wear a face mask and wash your hands often to prevent spread of the virus.  Use your inhaler more if you have asthma.  Do not use any tobacco products, including cigarettes, chewing tobacco, or electronic cigarettes. If you need help quitting, ask your doctor. Contact a doctor if:  You are getting worse, not better.  Your symptoms are not helped by medicine.  You have chills.  You are getting more short of breath.  You have brown or red mucus.  You have yellow or brown discharge from your nose.  You have pain in your face, especially when you  bend forward.  You have a fever.  You have puffy (swollen) neck glands.  You have pain while swallowing.  You have white areas in the back of your throat. Get help right away if:  You have very bad or constant: ? Headache. ? Ear pain. ? Pain in your forehead, behind your eyes, and over your cheekbones (sinus pain). ? Chest pain.  You have long-lasting (chronic) lung disease and any of the following: ? Wheezing. ? Long-lasting cough. ? Coughing up blood. ? A change in your usual mucus.  You have a stiff neck.  You have changes in your: ? Vision. ? Hearing. ? Thinking. ? Mood. This information is not intended to replace advice given to you by your health care provider. Make sure you discuss any questions you have with your health care provider. Document Released: 06/19/2007 Document Revised: 09/03/2015 Document Reviewed: 04/07/2013 Elsevier Interactive Patient Education  2018 [employer].

## 2017-02-27 DIAGNOSIS — C109 Malignant neoplasm of oropharynx, unspecified: Secondary | ICD-10-CM | POA: Diagnosis not present

## 2017-03-04 DIAGNOSIS — K219 Gastro-esophageal reflux disease without esophagitis: Secondary | ICD-10-CM | POA: Diagnosis not present

## 2017-03-04 DIAGNOSIS — Z79899 Other long term (current) drug therapy: Secondary | ICD-10-CM | POA: Diagnosis not present

## 2017-03-04 DIAGNOSIS — C100 Malignant neoplasm of vallecula: Secondary | ICD-10-CM | POA: Diagnosis not present

## 2017-03-04 DIAGNOSIS — Z7982 Long term (current) use of aspirin: Secondary | ICD-10-CM | POA: Diagnosis not present

## 2017-03-04 DIAGNOSIS — Z08 Encounter for follow-up examination after completed treatment for malignant neoplasm: Secondary | ICD-10-CM | POA: Diagnosis not present

## 2017-03-04 DIAGNOSIS — Z9289 Personal history of other medical treatment: Secondary | ICD-10-CM | POA: Diagnosis not present

## 2017-03-04 DIAGNOSIS — Z6826 Body mass index (BMI) 26.0-26.9, adult: Secondary | ICD-10-CM | POA: Diagnosis not present

## 2017-03-04 DIAGNOSIS — Z9049 Acquired absence of other specified parts of digestive tract: Secondary | ICD-10-CM | POA: Diagnosis not present

## 2017-03-04 DIAGNOSIS — Z9889 Other specified postprocedural states: Secondary | ICD-10-CM | POA: Diagnosis not present

## 2017-03-04 DIAGNOSIS — Z8719 Personal history of other diseases of the digestive system: Secondary | ICD-10-CM | POA: Diagnosis not present

## 2017-03-04 DIAGNOSIS — R569 Unspecified convulsions: Secondary | ICD-10-CM | POA: Diagnosis not present

## 2017-03-04 DIAGNOSIS — Z9089 Acquired absence of other organs: Secondary | ICD-10-CM | POA: Diagnosis not present

## 2017-03-04 DIAGNOSIS — Z85818 Personal history of malignant neoplasm of other sites of lip, oral cavity, and pharynx: Secondary | ICD-10-CM | POA: Diagnosis not present

## 2017-03-04 DIAGNOSIS — E785 Hyperlipidemia, unspecified: Secondary | ICD-10-CM | POA: Diagnosis not present

## 2017-03-04 DIAGNOSIS — Z809 Family history of malignant neoplasm, unspecified: Secondary | ICD-10-CM | POA: Diagnosis not present

## 2017-03-16 ENCOUNTER — Other Ambulatory Visit: Payer: Self-pay | Admitting: Internal Medicine

## 2017-03-26 ENCOUNTER — Encounter: Payer: BLUE CROSS/BLUE SHIELD | Admitting: Internal Medicine

## 2017-05-05 DIAGNOSIS — G40109 Localization-related (focal) (partial) symptomatic epilepsy and epileptic syndromes with simple partial seizures, not intractable, without status epilepticus: Secondary | ICD-10-CM | POA: Diagnosis not present

## 2017-05-26 ENCOUNTER — Other Ambulatory Visit: Payer: Self-pay | Admitting: Internal Medicine

## 2017-05-26 NOTE — Telephone Encounter (Signed)
Copied from [place] [id number]. Topic: Quick Communication - Rx Refill/Question >> May 26, 2017  4:22 PM Robina Ade, Helene Kelp D wrote: Medication:rosuvastatin (CRESTOR) 20 MG tablet Has the patient contacted their pharmacy?Yes and patient would like 30 day refill to last him until his next appt. (Agent: If no, request that the patient contact the pharmacy for the refill.) Preferred Pharmacy (with phone number or street name): [facility] [place], [address] AT [address] Agent: Please be advised that RX refills may take up to 3 business days. We ask that you follow-up with your pharmacy.

## 2017-05-27 NOTE — Telephone Encounter (Signed)
Crestor 20 mg refill request  Last refill:  02/04/17  #30  Had appt in March that was required to get this refilled but she cancelled it.  03/26/17.  No future appts noted.  Dr. Gwynn Burly pt.  [id number] - [place], [place] - [address]

## 2017-06-04 ENCOUNTER — Other Ambulatory Visit (INDEPENDENT_AMBULATORY_CARE_PROVIDER_SITE_OTHER): Payer: BLUE CROSS/BLUE SHIELD

## 2017-06-04 ENCOUNTER — Ambulatory Visit (INDEPENDENT_AMBULATORY_CARE_PROVIDER_SITE_OTHER): Payer: BLUE CROSS/BLUE SHIELD | Admitting: Internal Medicine

## 2017-06-04 ENCOUNTER — Other Ambulatory Visit: Payer: Self-pay | Admitting: Internal Medicine

## 2017-06-04 ENCOUNTER — Encounter: Payer: Self-pay | Admitting: Internal Medicine

## 2017-06-04 VITALS — BP 118/78 | HR 67 | Temp 97.7°F | Ht 73.0 in | Wt 198.0 lb

## 2017-06-04 DIAGNOSIS — Z0000 Encounter for general adult medical examination without abnormal findings: Secondary | ICD-10-CM

## 2017-06-04 DIAGNOSIS — Z23 Encounter for immunization: Secondary | ICD-10-CM | POA: Diagnosis not present

## 2017-06-04 DIAGNOSIS — Z114 Encounter for screening for human immunodeficiency virus [HIV]: Secondary | ICD-10-CM

## 2017-06-04 DIAGNOSIS — E785 Hyperlipidemia, unspecified: Secondary | ICD-10-CM

## 2017-06-04 DIAGNOSIS — R972 Elevated prostate specific antigen [PSA]: Secondary | ICD-10-CM

## 2017-06-04 LAB — BASIC METABOLIC PANEL
BUN: 17 mg/dL (ref 6–23)
CALCIUM: 9.5 mg/dL (ref 8.4–10.5)
CO2: 31 mEq/L (ref 19–32)
Chloride: 102 mEq/L (ref 96–112)
Creatinine, Ser: 1.05 mg/dL (ref 0.40–1.50)
GFR: 76.48 mL/min (ref >60.00)
Glucose, Bld: 94 mg/dL (ref 70–99)
POTASSIUM: 4 meq/L (ref 3.5–5.1)
SODIUM: 140 meq/L (ref 135–145)

## 2017-06-04 LAB — URINALYSIS, ROUTINE W REFLEX MICROSCOPIC
Bilirubin Urine: NEGATIVE
HGB URINE DIPSTICK: NEGATIVE
KETONES UR: NEGATIVE
Leukocytes, UA: NEGATIVE
NITRITE: NEGATIVE
RBC / HPF: NONE SEEN (ref 0–?)
Specific Gravity, Urine: >=1.03 — AB (ref 1.000–1.030)
TOTAL PROTEIN, URINE-UPE24: NEGATIVE
UROBILINOGEN UA: 0.2 (ref 0.0–1.0)
Urine Glucose: NEGATIVE
WBC, UA: NONE SEEN (ref 0–?)
pH: 5.5 (ref 5.0–8.0)

## 2017-06-04 LAB — HEPATIC FUNCTION PANEL
ALT: 13 U/L (ref 0–53)
AST: 10 U/L (ref 0–37)
Albumin: 4.4 g/dL (ref 3.5–5.2)
Alkaline Phosphatase: 66 U/L (ref 39–117)
Bilirubin, Direct: 0.2 mg/dL (ref 0.0–0.3)
TOTAL PROTEIN: 6.9 g/dL (ref 6.0–8.3)
Total Bilirubin: 0.9 mg/dL (ref 0.2–1.2)

## 2017-06-04 LAB — CBC WITH DIFFERENTIAL/PLATELET
BASOS PCT: 0.6 % (ref 0.0–3.0)
Basophils Absolute: 0 10*3/uL (ref 0.0–0.1)
EOS PCT: 0.1 % (ref 0.0–5.0)
Eosinophils Absolute: 0 10*3/uL (ref 0.0–0.7)
HEMATOCRIT: 44 % (ref 39.0–52.0)
HEMOGLOBIN: 15.3 g/dL (ref 13.0–17.0)
LYMPHS PCT: 29.1 % (ref 12.0–46.0)
Lymphs Abs: 2 10*3/uL (ref 0.7–4.0)
MCHC: 34.7 g/dL (ref 30.0–36.0)
MCV: 93.2 fl (ref 78.0–100.0)
Monocytes Absolute: 0.6 10*3/uL (ref 0.1–1.0)
Monocytes Relative: 9.1 % (ref 3.0–12.0)
Neutro Abs: 4.3 10*3/uL (ref 1.4–7.7)
Neutrophils Relative %: 61.1 % (ref 43.0–77.0)
Platelets: 172 10*3/uL (ref 150.0–400.0)
RBC: 4.72 Mil/uL (ref 4.22–5.81)
RDW: 13.4 % (ref 11.5–15.5)
WBC: 7 10*3/uL (ref 4.0–10.5)

## 2017-06-04 LAB — LIPID PANEL
CHOLESTEROL: 230 mg/dL — AB (ref 0–200)
HDL: 33 mg/dL — AB (ref >39.00)
LDL Cholesterol: 171 mg/dL — ABNORMAL HIGH (ref 0–99)
NonHDL: 197.33
Total CHOL/HDL Ratio: 7
Triglycerides: 130 mg/dL (ref 0.0–149.0)
VLDL: 26 mg/dL (ref 0.0–40.0)

## 2017-06-04 LAB — TSH: TSH: 3.16 u[IU]/mL (ref 0.35–4.50)

## 2017-06-04 LAB — PSA: PSA: 4.11 ng/mL — AB (ref 0.10–4.00)

## 2017-06-04 MED ORDER — ROSUVASTATIN CALCIUM 20 MG PO TABS: ORAL_TABLET | ORAL | 3 refills | Status: DC

## 2017-06-04 NOTE — Patient Instructions (Addendum)
You had the Tdap tetanus shot today  Please return in 3 months for a repeat cholesterol to the LAB only  Please continue all other medications as before, and refills have been done if requested, including to restart the crestor  Please have the pharmacy call with any other refills you may need.  Please continue your efforts at being more active, low cholesterol diet, and weight control.  You are otherwise up to date with prevention measures today.  Please keep your appointments with your specialists as you may have planned  Please go to the LAB in the Basement (turn left off the elevator) for the tests to be done today  You will be contacted by phone if any changes need to be made immediately.  Otherwise, you will receive a letter about your results with an explanation, but please check with MyChart first.  Please remember to sign up for MyChart if you have not done so, as this will be important to you in the future with finding out test results, communicating by private email, and scheduling acute appointments online when needed.\  Please return in 1 year for your yearly visit, or sooner if needed, with Lab testing done 3-5 days before

## 2017-06-04 NOTE — Progress Notes (Signed)
Subjective:    Patient ID: Bradley Roberts, male    DOB: 17-Nov-1955, 62 y.o.   MRN: [id number]  HPI  Here for wellness and f/u;  Overall doing ok;  Pt denies Chest pain, worsening SOB, DOE, wheezing, orthopnea, PND, worsening LE edema, palpitations, dizziness or syncope.  Pt denies neurological change such as new headache, facial or extremity weakness.  Pt denies polydipsia, polyuria, or low sugar symptoms. Pt states overall good compliance with treatment and medications, good tolerability, and has been trying to follow appropriate diet.  Pt denies worsening depressive symptoms, suicidal ideation or panic. No fever, night sweats, wt loss, loss of appetite, or other constitutional symptoms.  Pt states good ability with ADL's, has low fall risk, home safety reviewed and adequate, no other significant changes in hearing or vision, and only occasionally active with exercise.  Had a breakup with girlfriend, more stress recently, feeling down but Denies worsening depressive symptoms, suicidal ideation, or panic ;  No further siezures since last year; now working on commision with more stress as well.  No other new complaints or interval change.  Mentions he has been out of the crestor x 2 mo but willing to restart. Had no prior issues with this Past Medical History:  Diagnosis Date  . ALLERGIC RHINITIS 11/03/2006  . ANXIETY 09/15/2006  . 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 GERD 09/15/2006  . Hx of adenomatous polyp of rectum 02/13/2016  . HYPERLIPIDEMIA 09/13/2006  . OBSTRUCTIVE SLEEP APNEA 09/13/2006   test about 2002-mild-mod-did not use cpap-  . Seizures ([place])    first seizure was 10/26/2015/ pt is followed by Dr. Arlyn Leak at [place], RIGHT 11/03/2006  . Sleep apnea    on c-pap  . TESTICULAR MASS 11/03/2009   Past Surgical History:  Procedure Laterality Date  . ARTHROSCOPIC REPAIR ACL  2008   rt  . INGUINAL HERNIA REPAIR Right 06/26/2012   Procedure: HERNIA REPAIR INGUINAL ADULT;   Surgeon: Earnstine Regal, MD;  Location: [place];  Service: General;  Laterality: Right;  . inguinal herniorrhapy     age 40-6 both rt and lt  . INSERTION OF MESH Right 06/26/2012   Procedure: INSERTION OF MESH;  Surgeon: Earnstine Regal, MD;  Location: [place];  Service: General;  Laterality: Right;  . RENAL BIOPSY  1979   lt-  . TONSILLECTOMY    . WISDOM TOOTH EXTRACTION      reports that he has never smoked. He has never used smokeless tobacco. He reports that he drinks about 1.8 - 2.4 oz of alcohol per week. He reports that he does not use drugs. family history includes Diabetes in his paternal grandmother; Heart attack in his father, maternal grandfather, and other; Liver cancer in his mother; Lung cancer in his mother. No Known Allergies Current Outpatient Medications on File Prior to Visit  Medication Sig Dispense Refill  . aspirin 81 MG EC tablet Take 81 mg by mouth daily.      Marland Kitchen esomeprazole (NEXIUM) 20 MG capsule Take 20 mg by mouth daily at 12 noon.    . Oxcarbazepine (TRILEPTAL) 300 MG tablet Take 300 mg by mouth 2 (two) times daily.     No current facility-administered medications on file prior to visit.    Review of Systems Constitutional: Negative for other unusual diaphoresis, sweats, appetite or weight changes HENT: Negative for other worsening hearing loss, ear pain, facial swelling, mouth sores or neck stiffness.   Eyes:  Negative for other worsening pain, redness or other visual disturbance.  Respiratory: Negative for other stridor or swelling Cardiovascular: Negative for other palpitations or other chest pain  Gastrointestinal: Negative for worsening diarrhea or loose stools, blood in stool, distention or other pain Genitourinary: Negative for hematuria, flank pain or other change in urine volume.  Musculoskeletal: Negative for myalgias or other joint swelling.  Skin: Negative for other color change, or other wound or worsening drainage.   Neurological: Negative for other syncope or numbness. Hematological: Negative for other adenopathy or swelling Psychiatric/Behavioral: Negative for hallucinations, other worsening agitation, SI, self-injury, or new decreased concentration All other system neg per pt    Objective:   Physical Exam BP 118/78   Pulse 67   Temp 97.7 F (36.5 C) (Oral)   Ht 6\' 1"  (1.854 m)   Wt 198 lb (89.8 kg)   SpO2 98%   BMI 26.12 kg/m  VS noted,  Constitutional: Pt is oriented to person, place, and time. Appears well-developed and well-nourished, in no significant distress and comfortable Head: Normocephalic and atraumatic  Eyes: Conjunctivae and EOM are normal. Pupils are equal, round, and reactive to light Right Ear: External ear normal without discharge Left Ear: External ear normal without discharge Nose: Nose without discharge or deformity Mouth/Throat: Oropharynx is without other ulcerations and moist  Neck: Normal range of motion. Neck supple. No JVD present. No tracheal deviation present or significant neck LA or mass Cardiovascular: Normal rate, regular rhythm, normal heart sounds and intact distal pulses.   Pulmonary/Chest: WOB normal and breath sounds without rales or wheezing  Abdominal: Soft. Bowel sounds are normal. NT. No HSM  Musculoskeletal: Normal range of motion. Exhibits no edema Lymphadenopathy: Has no other cervical adenopathy.  Neurological: Pt is alert and oriented to person, place, and time. Pt has normal reflexes. No cranial nerve deficit. Motor grossly intact, Gait intact Skin: Skin is warm and dry. No rash noted or new ulcerations Psychiatric:  Has normal mood and affect. Behavior is normal without agitation No other exam findings Lab Results  Component Value Date   WBC 8.6 10/30/2015   HGB 15.0 10/30/2015   HCT 42.9 10/30/2015   PLT 198.0 10/30/2015   GLUCOSE 109 (H) 03/22/2016   CHOL 228 (H) 11/21/2015   TRIG 182.0 (H) 11/21/2015   HDL 30.90 (L) 11/21/2015    LDLDIRECT 150.0 11/07/2014   LDLCALC 161 (H) 11/21/2015   ALT 21 10/30/2015   AST 16 10/30/2015   NA 143 03/22/2016   K 3.9 03/22/2016   CL 106 03/22/2016   CREATININE 1.18 03/22/2016   BUN 19 03/22/2016   CO2 29 03/22/2016   TSH 3.80 10/30/2015   PSA 3.11 11/21/2015       Assessment & Plan:

## 2017-06-04 NOTE — Addendum Note (Signed)
Addended by: Juliet Rude on: 06/04/2017 02:37 PM   Modules accepted: Orders

## 2017-06-05 ENCOUNTER — Telehealth: Payer: Self-pay

## 2017-06-05 DIAGNOSIS — Z9889 Other specified postprocedural states: Secondary | ICD-10-CM | POA: Diagnosis not present

## 2017-06-05 DIAGNOSIS — Z7982 Long term (current) use of aspirin: Secondary | ICD-10-CM | POA: Diagnosis not present

## 2017-06-05 DIAGNOSIS — K219 Gastro-esophageal reflux disease without esophagitis: Secondary | ICD-10-CM | POA: Diagnosis not present

## 2017-06-05 DIAGNOSIS — Z6826 Body mass index (BMI) 26.0-26.9, adult: Secondary | ICD-10-CM | POA: Diagnosis not present

## 2017-06-05 DIAGNOSIS — C01 Malignant neoplasm of base of tongue: Secondary | ICD-10-CM | POA: Diagnosis not present

## 2017-06-05 LAB — HIV ANTIBODY (ROUTINE TESTING W REFLEX): HIV: NONREACTIVE

## 2017-06-05 NOTE — Telephone Encounter (Signed)
-----   Message from Biagio Borg, MD sent at 06/04/2017  5:16 PM EDT ----- Left message on MyChart, pt to cont same tx except  The test results show that your current treatment is OK., except the LDL cholesterol is high (so ok to take the crestor), but also as we had a premonition today, your PSA was mildly elevated.  This may not be significant, but we need to refer you to urology for further consideration.  You should hear soon.   Sh[place] to please inform pt, I will do referral

## 2017-06-05 NOTE — Telephone Encounter (Signed)
Pt has been informed and expressed understanding.  

## 2017-06-06 DIAGNOSIS — R918 Other nonspecific abnormal finding of lung field: Secondary | ICD-10-CM | POA: Diagnosis not present

## 2017-06-06 DIAGNOSIS — C01 Malignant neoplasm of base of tongue: Secondary | ICD-10-CM | POA: Diagnosis not present

## 2017-08-14 DIAGNOSIS — N5201 Erectile dysfunction due to arterial insufficiency: Secondary | ICD-10-CM | POA: Diagnosis not present

## 2017-08-14 DIAGNOSIS — R972 Elevated prostate specific antigen [PSA]: Secondary | ICD-10-CM | POA: Diagnosis not present

## 2017-09-09 DIAGNOSIS — C760 Malignant neoplasm of head, face and neck: Secondary | ICD-10-CM | POA: Diagnosis not present

## 2017-09-09 DIAGNOSIS — K5730 Diverticulosis of large intestine without perforation or abscess without bleeding: Secondary | ICD-10-CM | POA: Diagnosis not present

## 2017-09-09 DIAGNOSIS — Z79899 Other long term (current) drug therapy: Secondary | ICD-10-CM | POA: Diagnosis not present

## 2017-09-09 DIAGNOSIS — K219 Gastro-esophageal reflux disease without esophagitis: Secondary | ICD-10-CM | POA: Diagnosis not present

## 2017-09-09 DIAGNOSIS — N200 Calculus of kidney: Secondary | ICD-10-CM | POA: Diagnosis not present

## 2017-09-09 DIAGNOSIS — C090 Malignant neoplasm of tonsillar fossa: Secondary | ICD-10-CM | POA: Diagnosis not present

## 2017-09-09 DIAGNOSIS — R569 Unspecified convulsions: Secondary | ICD-10-CM | POA: Diagnosis not present

## 2017-09-09 DIAGNOSIS — R1314 Dysphagia, pharyngoesophageal phase: Secondary | ICD-10-CM | POA: Diagnosis not present

## 2017-09-09 DIAGNOSIS — R972 Elevated prostate specific antigen [PSA]: Secondary | ICD-10-CM | POA: Diagnosis not present

## 2017-09-09 DIAGNOSIS — E785 Hyperlipidemia, unspecified: Secondary | ICD-10-CM | POA: Diagnosis not present

## 2017-09-09 DIAGNOSIS — Z6827 Body mass index (BMI) 27.0-27.9, adult: Secondary | ICD-10-CM | POA: Diagnosis not present

## 2017-09-09 DIAGNOSIS — R9341 Abnormal radiologic findings on diagnostic imaging of renal pelvis, ureter, or bladder: Secondary | ICD-10-CM | POA: Diagnosis not present

## 2017-09-09 DIAGNOSIS — C100 Malignant neoplasm of vallecula: Secondary | ICD-10-CM | POA: Diagnosis not present

## 2017-09-09 DIAGNOSIS — C109 Malignant neoplasm of oropharynx, unspecified: Secondary | ICD-10-CM | POA: Diagnosis not present

## 2017-09-09 DIAGNOSIS — C01 Malignant neoplasm of base of tongue: Secondary | ICD-10-CM | POA: Diagnosis not present

## 2017-09-18 DIAGNOSIS — R972 Elevated prostate specific antigen [PSA]: Secondary | ICD-10-CM | POA: Diagnosis not present

## 2017-09-24 DIAGNOSIS — N5201 Erectile dysfunction due to arterial insufficiency: Secondary | ICD-10-CM | POA: Diagnosis not present

## 2017-09-24 DIAGNOSIS — R972 Elevated prostate specific antigen [PSA]: Secondary | ICD-10-CM | POA: Diagnosis not present

## 2017-11-07 DIAGNOSIS — G40109 Localization-related (focal) (partial) symptomatic epilepsy and epileptic syndromes with simple partial seizures, not intractable, without status epilepticus: Secondary | ICD-10-CM | POA: Diagnosis not present

## 2017-11-07 DIAGNOSIS — R569 Unspecified convulsions: Secondary | ICD-10-CM | POA: Diagnosis not present

## 2017-12-30 DIAGNOSIS — K4090 Unilateral inguinal hernia, without obstruction or gangrene, not specified as recurrent: Secondary | ICD-10-CM | POA: Diagnosis not present

## 2017-12-30 DIAGNOSIS — Z8249 Family history of ischemic heart disease and other diseases of the circulatory system: Secondary | ICD-10-CM | POA: Diagnosis not present

## 2017-12-30 DIAGNOSIS — Z6827 Body mass index (BMI) 27.0-27.9, adult: Secondary | ICD-10-CM | POA: Diagnosis not present

## 2017-12-30 DIAGNOSIS — G4733 Obstructive sleep apnea (adult) (pediatric): Secondary | ICD-10-CM | POA: Diagnosis not present

## 2017-12-30 DIAGNOSIS — C109 Malignant neoplasm of oropharynx, unspecified: Secondary | ICD-10-CM | POA: Diagnosis not present

## 2017-12-30 DIAGNOSIS — R569 Unspecified convulsions: Secondary | ICD-10-CM | POA: Diagnosis not present

## 2017-12-30 DIAGNOSIS — R221 Localized swelling, mass and lump, neck: Secondary | ICD-10-CM | POA: Diagnosis not present

## 2017-12-30 DIAGNOSIS — C01 Malignant neoplasm of base of tongue: Secondary | ICD-10-CM | POA: Diagnosis not present

## 2017-12-30 DIAGNOSIS — Z86010 Personal history of colonic polyps: Secondary | ICD-10-CM | POA: Diagnosis not present

## 2017-12-30 DIAGNOSIS — R030 Elevated blood-pressure reading, without diagnosis of hypertension: Secondary | ICD-10-CM | POA: Diagnosis not present

## 2018-03-10 DIAGNOSIS — C760 Malignant neoplasm of head, face and neck: Secondary | ICD-10-CM | POA: Diagnosis not present

## 2018-03-10 DIAGNOSIS — C109 Malignant neoplasm of oropharynx, unspecified: Secondary | ICD-10-CM | POA: Diagnosis not present

## 2018-03-10 DIAGNOSIS — Z6828 Body mass index (BMI) 28.0-28.9, adult: Secondary | ICD-10-CM | POA: Diagnosis not present

## 2018-03-10 DIAGNOSIS — K219 Gastro-esophageal reflux disease without esophagitis: Secondary | ICD-10-CM | POA: Diagnosis not present

## 2018-03-10 DIAGNOSIS — E785 Hyperlipidemia, unspecified: Secondary | ICD-10-CM | POA: Diagnosis not present

## 2018-04-09 ENCOUNTER — Other Ambulatory Visit: Payer: Self-pay | Admitting: Internal Medicine

## 2018-06-02 DIAGNOSIS — B977 Papillomavirus as the cause of diseases classified elsewhere: Secondary | ICD-10-CM | POA: Diagnosis not present

## 2018-06-02 DIAGNOSIS — C01 Malignant neoplasm of base of tongue: Secondary | ICD-10-CM | POA: Diagnosis not present

## 2018-06-02 DIAGNOSIS — C109 Malignant neoplasm of oropharynx, unspecified: Secondary | ICD-10-CM | POA: Diagnosis not present

## 2018-06-02 DIAGNOSIS — Z6827 Body mass index (BMI) 27.0-27.9, adult: Secondary | ICD-10-CM | POA: Diagnosis not present

## 2018-06-22 ENCOUNTER — Other Ambulatory Visit (INDEPENDENT_AMBULATORY_CARE_PROVIDER_SITE_OTHER): Payer: BC Managed Care – PPO

## 2018-06-22 ENCOUNTER — Ambulatory Visit (INDEPENDENT_AMBULATORY_CARE_PROVIDER_SITE_OTHER): Payer: BC Managed Care – PPO | Admitting: Internal Medicine

## 2018-06-22 ENCOUNTER — Encounter: Payer: Self-pay | Admitting: Internal Medicine

## 2018-06-22 ENCOUNTER — Other Ambulatory Visit: Payer: Self-pay

## 2018-06-22 VITALS — BP 120/86 | HR 74 | Temp 98.6°F | Ht 73.0 in | Wt 208.0 lb

## 2018-06-22 DIAGNOSIS — E559 Vitamin D deficiency, unspecified: Secondary | ICD-10-CM

## 2018-06-22 DIAGNOSIS — Z0000 Encounter for general adult medical examination without abnormal findings: Secondary | ICD-10-CM | POA: Diagnosis not present

## 2018-06-22 DIAGNOSIS — E538 Deficiency of other specified B group vitamins: Secondary | ICD-10-CM

## 2018-06-22 DIAGNOSIS — Z125 Encounter for screening for malignant neoplasm of prostate: Secondary | ICD-10-CM | POA: Diagnosis not present

## 2018-06-22 DIAGNOSIS — R972 Elevated prostate specific antigen [PSA]: Secondary | ICD-10-CM

## 2018-06-22 DIAGNOSIS — E611 Iron deficiency: Secondary | ICD-10-CM

## 2018-06-22 LAB — CBC WITH DIFFERENTIAL/PLATELET
Basophils Absolute: 0 10*3/uL (ref 0.0–0.1)
Basophils Relative: 0.3 % (ref 0.0–3.0)
Eosinophils Absolute: 0.1 10*3/uL (ref 0.0–0.7)
Eosinophils Relative: 1.1 % (ref 0.0–5.0)
HCT: 42.7 % (ref 39.0–52.0)
Hemoglobin: 14.8 g/dL (ref 13.0–17.0)
Lymphocytes Relative: 25.3 % (ref 12.0–46.0)
Lymphs Abs: 1.9 10*3/uL (ref 0.7–4.0)
MCHC: 34.6 g/dL (ref 30.0–36.0)
MCV: 94.2 fl (ref 78.0–100.0)
Monocytes Absolute: 0.7 10*3/uL (ref 0.1–1.0)
Monocytes Relative: 8.7 % (ref 3.0–12.0)
Neutro Abs: 4.8 10*3/uL (ref 1.4–7.7)
Neutrophils Relative %: 64.6 % (ref 43.0–77.0)
Platelets: 180 10*3/uL (ref 150.0–400.0)
RBC: 4.54 Mil/uL (ref 4.22–5.81)
RDW: 13.4 % (ref 11.5–15.5)
WBC: 7.5 10*3/uL (ref 4.0–10.5)

## 2018-06-22 LAB — BASIC METABOLIC PANEL
BUN: 17 mg/dL (ref 6–23)
CO2: 24 mEq/L (ref 19–32)
Calcium: 9.1 mg/dL (ref 8.4–10.5)
Chloride: 107 mEq/L (ref 96–112)
Creatinine, Ser: 1.09 mg/dL (ref 0.40–1.50)
GFR: 68.68 mL/min (ref >60.00)
Glucose, Bld: 100 mg/dL — ABNORMAL HIGH (ref 70–99)
Potassium: 3.9 mEq/L (ref 3.5–5.1)
Sodium: 142 mEq/L (ref 135–145)

## 2018-06-22 LAB — URINALYSIS, ROUTINE W REFLEX MICROSCOPIC
Bilirubin Urine: NEGATIVE
Hgb urine dipstick: NEGATIVE
Ketones, ur: NEGATIVE
Leukocytes,Ua: NEGATIVE
Nitrite: NEGATIVE
RBC / HPF: NONE SEEN (ref 0–?)
Specific Gravity, Urine: >=1.03 — AB (ref 1.000–1.030)
Total Protein, Urine: NEGATIVE
Urine Glucose: NEGATIVE
Urobilinogen, UA: 0.2 (ref 0.0–1.0)
pH: 5.5 (ref 5.0–8.0)

## 2018-06-22 LAB — LIPID PANEL
Cholesterol: 161 mg/dL (ref 0–200)
HDL: 36.4 mg/dL — ABNORMAL LOW (ref >39.00)
LDL Cholesterol: 102 mg/dL — ABNORMAL HIGH (ref 0–99)
NonHDL: 124.61
Total CHOL/HDL Ratio: 4
Triglycerides: 111 mg/dL (ref 0.0–149.0)
VLDL: 22.2 mg/dL (ref 0.0–40.0)

## 2018-06-22 LAB — HEPATIC FUNCTION PANEL
ALT: 27 U/L (ref 0–53)
AST: 17 U/L (ref 0–37)
Albumin: 4.5 g/dL (ref 3.5–5.2)
Alkaline Phosphatase: 67 U/L (ref 39–117)
Bilirubin, Direct: 0.2 mg/dL (ref 0.0–0.3)
Total Bilirubin: 0.6 mg/dL (ref 0.2–1.2)
Total Protein: 6.6 g/dL (ref 6.0–8.3)

## 2018-06-22 LAB — TSH: TSH: 2.79 u[IU]/mL (ref 0.35–4.50)

## 2018-06-22 LAB — PSA: PSA: 5.6 ng/mL — ABNORMAL HIGH (ref 0.10–4.00)

## 2018-06-22 NOTE — Assessment & Plan Note (Signed)

## 2018-06-22 NOTE — Patient Instructions (Signed)
Please continue all other medications as before, and refills have been done if requested.  Please have the pharmacy call with any other refills you may need.  Please continue your efforts at being more active, low cholesterol diet, and weight control.  You are otherwise up to date with prevention measures today.  Please keep your appointments with your specialists as you may have planned  Please return in 1 year for your yearly visit, or sooner if needed, with Lab testing done 3-5 days before  

## 2018-06-22 NOTE — Progress Notes (Signed)
Subjective:    Patient ID: Bradley Roberts, male    DOB: [birth year], 63 y.o.   MRN: [id number]  HPI  Here for wellness and f/u;  Overall doing ok;  Pt denies Chest pain, worsening SOB, DOE, wheezing, orthopnea, PND, worsening LE edema, palpitations, dizziness or syncope.  Pt denies neurological change such as new headache, facial or extremity weakness.  Pt denies polydipsia, polyuria, or low sugar symptoms. Pt states overall good compliance with treatment and medications, good tolerability, and has been trying to follow appropriate diet.  Pt denies worsening depressive symptoms, suicidal ideation or panic. No fever, night sweats, wt loss, loss of appetite, or other constitutional symptoms.  Pt states good ability with ADL's, has low fall risk, home safety reviewed and adequate, no other significant changes in hearing or vision, and getting now more active with exercise with outdoor biking.  Plans to call for appt with [facility] Dr Gloriann Loan soon for elev PSA with neg biopsy 2019.  Has finished surgury and chemo per [facility] for [facility]NT cancer.  No new complaints Past Medical History:  Diagnosis Date  . ALLERGIC RHINITIS 11/03/2006  . ANXIETY 09/15/2006  . 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 GERD 09/15/2006  . Hx of adenomatous polyp of rectum 02/13/2016  . HYPERLIPIDEMIA 09/13/2006  . OBSTRUCTIVE SLEEP APNEA 09/13/2006   test about 2002-mild-mod-did not use cpap-  . Seizures ([place])    first seizure was 10/26/2015/ pt is followed by Dr. Arlyn Leak at [place], RIGHT 11/03/2006  . Sleep apnea    on c-pap  . TESTICULAR MASS 11/03/2009   Past Surgical History:  Procedure Laterality Date  . ARTHROSCOPIC REPAIR ACL  2008   rt  . INGUINAL HERNIA REPAIR Right 06/26/2012   Procedure: HERNIA REPAIR INGUINAL ADULT;  Surgeon: Earnstine Regal, MD;  Location: [place];  Service: General;  Laterality: Right;  . inguinal herniorrhapy     age [age] both rt and lt  . INSERTION OF MESH Right  06/26/2012   Procedure: INSERTION OF MESH;  Surgeon: Earnstine Regal, MD;  Location: [place];  Service: General;  Laterality: Right;  . RENAL BIOPSY  1979   lt-  . TONSILLECTOMY    . WISDOM TOOTH EXTRACTION      reports that he has never smoked. He has never used smokeless tobacco. He reports current alcohol use of about 3.0 - 4.0 standard drinks of alcohol per week. He reports that he does not use drugs. family history includes Diabetes in his paternal grandmother; Heart attack in his father, maternal grandfather, and another family member; Liver cancer in his mother; Lung cancer in his mother. No Known Allergies Current Outpatient Medications on File Prior to Visit  Medication Sig Dispense Refill  . esomeprazole (NEXIUM) 20 MG capsule Take 20 mg by mouth daily at 12 noon.    . Oxcarbazepine (TRILEPTAL) 300 MG tablet Take 300 mg by mouth 2 (two) times daily.    . rosuvastatin (CRESTOR) 20 MG tablet TAKE 1 TABLET BY MOUTH EVERY DAY 90 tablet 1   No current facility-administered medications on file prior to visit.    Review of Systems Constitutional: Negative for other unusual diaphoresis, sweats, appetite or weight changes HENT: Negative for other worsening hearing loss, ear pain, facial swelling, mouth sores or neck stiffness.   Eyes: Negative for other worsening pain, redness or other visual disturbance.  Respiratory: Negative for other stridor or swelling Cardiovascular: Negative for other palpitations or other  chest pain  Gastrointestinal: Negative for worsening diarrhea or loose stools, blood in stool, distention or other pain Genitourinary: Negative for hematuria, flank pain or other change in urine volume.  Musculoskeletal: Negative for myalgias or other joint swelling.  Skin: Negative for other color change, or other wound or worsening drainage.  Neurological: Negative for other syncope or numbness. Hematological: Negative for other adenopathy or swelling  Psychiatric/Behavioral: Negative for hallucinations, other worsening agitation, SI, self-injury, or new decreased concentration All other system neg per pt    Objective:   Physical Exam BP 120/86 (BP Location: Left Arm, Patient Position: Sitting, Cuff Size: Large)   Pulse 74   Temp 98.6 F (37 C) (Oral)   Ht 6\' 1"  (1.854 m)   Wt 208 lb (94.3 kg)   SpO2 97%   BMI 27.44 kg/m  VS noted,  Constitutional: Pt is oriented to person, place, and time. Appears well-developed and well-nourished, in no significant distress and comfortable Head: Normocephalic and atraumatic  Eyes: Conjunctivae and EOM are normal. Pupils are equal, round, and reactive to light Right Ear: External ear normal without discharge Left Ear: External ear normal without discharge Nose: Nose without discharge or deformity Mouth/Throat: Oropharynx is without other ulcerations and moist  Neck: Normal range of motion. Neck supple. No JVD present. No tracheal deviation present or significant neck LA or mass Cardiovascular: Normal rate, regular rhythm, normal heart sounds and intact distal pulses.   Pulmonary/Chest: WOB normal and breath sounds without rales or wheezing  Abdominal: Soft. Bowel sounds are normal. NT. No HSM  Musculoskeletal: Normal range of motion. Exhibits no edema Lymphadenopathy: Has no other cervical adenopathy.  Neurological: Pt is alert and oriented to person, place, and time. Pt has normal reflexes. No cranial nerve deficit. Motor grossly intact, Gait intact Skin: Skin is warm and dry. No rash noted or new ulcerations Psychiatric:  Has normal mood and affect. Behavior is normal without agitation\ No other exam findings Lab Results  Component Value Date   WBC 7.5 06/22/2018   HGB 14.8 06/22/2018   HCT 42.7 06/22/2018   PLT 180.0 06/22/2018   GLUCOSE 100 (H) 06/22/2018   CHOL 161 06/22/2018   TRIG 111.0 06/22/2018   HDL 36.40 (L) 06/22/2018   LDLDIRECT 150.0 11/07/2014   LDLCALC 102 (H)  06/22/2018   ALT 27 06/22/2018   AST 17 06/22/2018   NA 142 06/22/2018   K 3.9 06/22/2018   CL 107 06/22/2018   CREATININE 1.09 06/22/2018   BUN 17 06/22/2018   CO2 24 06/22/2018   TSH 2.79 06/22/2018   PSA 5.60 (H) 06/22/2018       Assessment & Plan:

## 2018-06-22 NOTE — Assessment & Plan Note (Signed)
With prostate bx neg x 1, to f/u urology as planned

## 2018-09-08 DIAGNOSIS — Z6827 Body mass index (BMI) 27.0-27.9, adult: Secondary | ICD-10-CM | POA: Diagnosis not present

## 2018-09-08 DIAGNOSIS — K219 Gastro-esophageal reflux disease without esophagitis: Secondary | ICD-10-CM | POA: Diagnosis not present

## 2018-09-08 DIAGNOSIS — E785 Hyperlipidemia, unspecified: Secondary | ICD-10-CM | POA: Diagnosis not present

## 2018-09-08 DIAGNOSIS — C109 Malignant neoplasm of oropharynx, unspecified: Secondary | ICD-10-CM | POA: Diagnosis not present

## 2018-09-08 DIAGNOSIS — M47812 Spondylosis without myelopathy or radiculopathy, cervical region: Secondary | ICD-10-CM | POA: Diagnosis not present

## 2018-09-08 DIAGNOSIS — J341 Cyst and mucocele of nose and nasal sinus: Secondary | ICD-10-CM | POA: Diagnosis not present

## 2018-09-08 DIAGNOSIS — R918 Other nonspecific abnormal finding of lung field: Secondary | ICD-10-CM | POA: Diagnosis not present

## 2018-10-07 ENCOUNTER — Other Ambulatory Visit: Payer: Self-pay | Admitting: Internal Medicine

## 2018-10-28 DIAGNOSIS — G40109 Localization-related (focal) (partial) symptomatic epilepsy and epileptic syndromes with simple partial seizures, not intractable, without status epilepticus: Secondary | ICD-10-CM | POA: Diagnosis not present

## 2018-11-23 ENCOUNTER — Telehealth: Payer: Self-pay

## 2018-11-23 NOTE — Telephone Encounter (Signed)
Copied from [place] #[id number]. 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 [phone] Please advise.

## 2018-11-24 NOTE — Telephone Encounter (Signed)
[place] for Nurse Visit for MMR to be done

## 2018-11-24 NOTE — Telephone Encounter (Signed)
Noted  

## 2018-11-24 NOTE — Telephone Encounter (Signed)
Called patient to schedule. He states that he was called into work and had to travel to [place] so he had the injection done yesterday (11/23/2018) at [facility]. He also mentioned that he had a titer test done last week and everything was fine except measles.

## 2018-12-15 DIAGNOSIS — R569 Unspecified convulsions: Secondary | ICD-10-CM | POA: Diagnosis not present

## 2018-12-15 DIAGNOSIS — C109 Malignant neoplasm of oropharynx, unspecified: Secondary | ICD-10-CM | POA: Diagnosis not present

## 2018-12-15 DIAGNOSIS — Z08 Encounter for follow-up examination after completed treatment for malignant neoplasm: Secondary | ICD-10-CM | POA: Diagnosis not present

## 2018-12-15 DIAGNOSIS — Z6826 Body mass index (BMI) 26.0-26.9, adult: Secondary | ICD-10-CM | POA: Diagnosis not present

## 2018-12-15 DIAGNOSIS — K219 Gastro-esophageal reflux disease without esophagitis: Secondary | ICD-10-CM | POA: Diagnosis not present

## 2018-12-15 DIAGNOSIS — Z85810 Personal history of malignant neoplasm of tongue: Secondary | ICD-10-CM | POA: Diagnosis not present

## 2019-02-21 IMAGING — CT CT NECK W/ CM
2 of 4 series · 6 of 14 positions shown, 7 images · IV contrast (iopamidol)
Comparison: None.

CLINICAL DATA: Left submandibular mass.

EXAM:
CT NECK WITH CONTRAST
TECHNIQUE: Multidetector CT imaging of the neck was performed using the
standard protocol following the bolus administration of intravenous
contrast.
CONTRAST:  75mL KLZJ8Z-MII IOPAMIDOL (KLZJ8Z-MII) INJECTION 61%

[Series 3: neck · axial · 0.50mm/px · z∈[-253,-163]mm · 2 of 136 slices shown]
[im 46/136  bone]
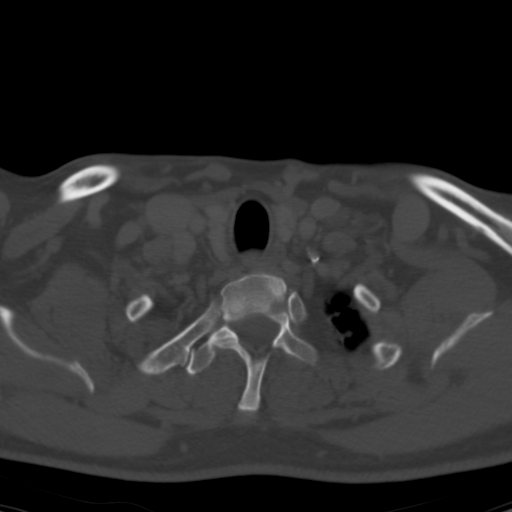
[im 91/136  bone]
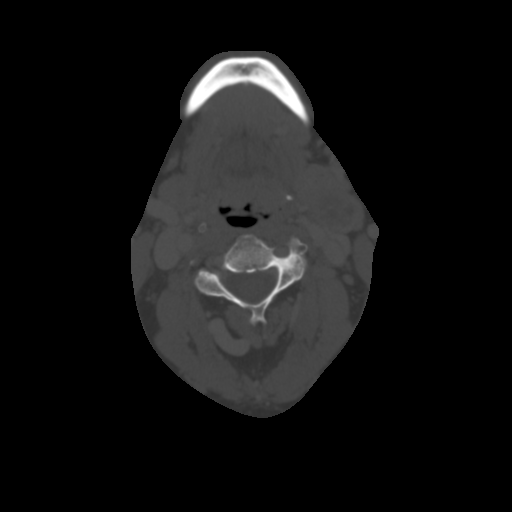

[Series 8: angled axial-oropharynx · axial · 0.39mm/px · z∈[-337,-151]mm · 4 of 160 slices shown, 5 images]
[im 32/160  soft-tissue]
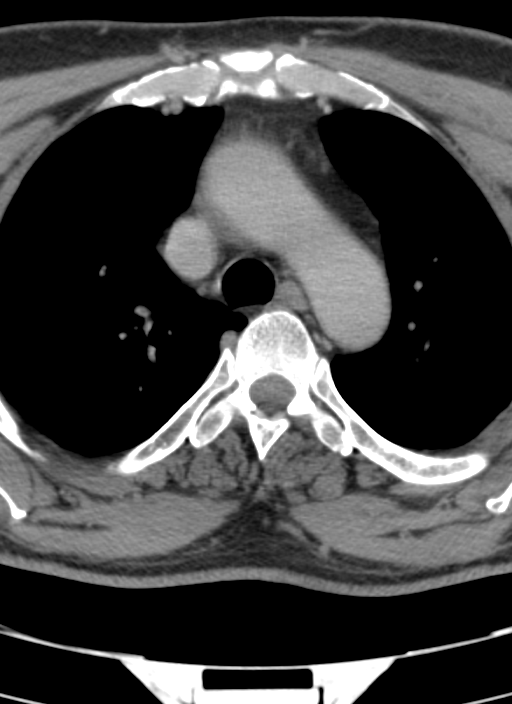
[im 32/160  bone]
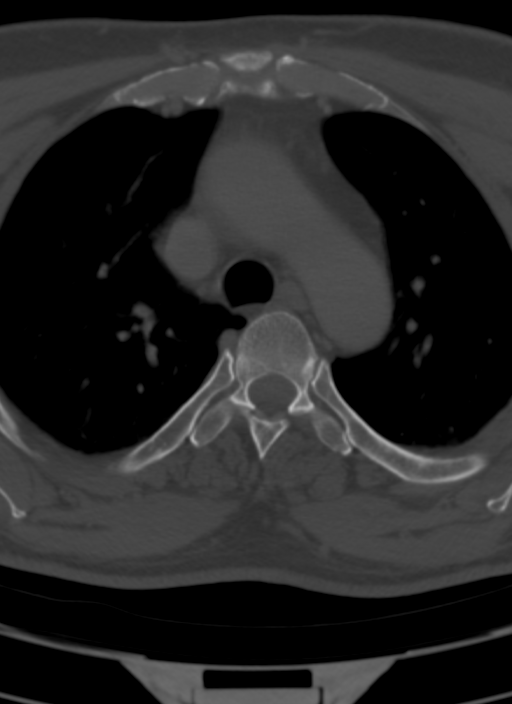
[im 64/160  bone]
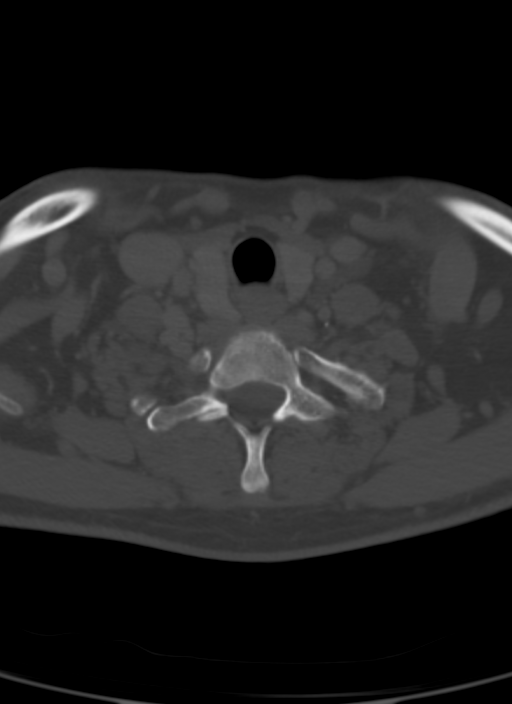
[im 96/160  bone]
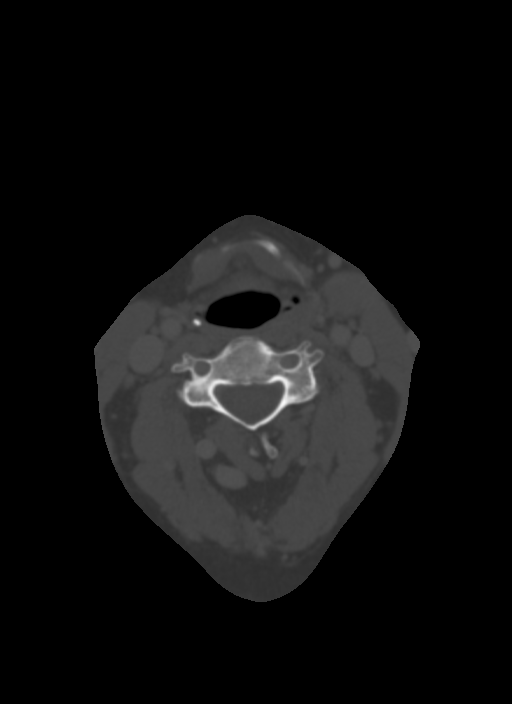
[im 128/160  bone]
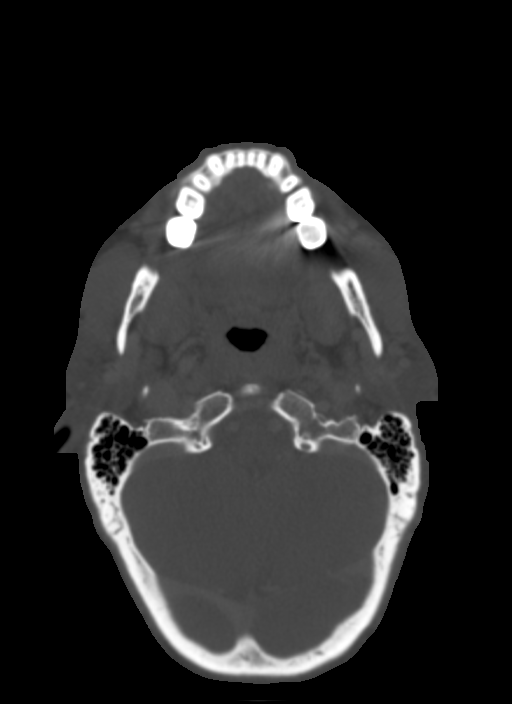

[6 of 14 positions shown; findings below may reference images not displayed]

FINDINGS: Pharynx and larynx: Asymmetric hyperdense tissue is present at the
left glossotonsillar sulcus extending into the left vallecula. This
is worrisome for a primary neoplasm. Recommend ENT evaluation. The
nasopharynx is within normal limits. The vocal cords are midline and
symmetric

Salivary glands: The left submandibular gland is displaced
anteriorly by a heterogeneous nodal mass. No intrinsic lesions are
present. The right submandibular gland is normal. The parotid glands
are within normal limits bilaterally.

Thyroid: Negative.

Lymph nodes: A heterogeneous anterior left level 2 lymph node
measures 3.5 x 2.3 x 2.8 cm. Just inferior are 2 level 3 lymph nodes
also concerning for metastatic disease. The more superior node
measures 14 x 9 mm. The more inferior node measures 10 x 5 mm. No
significant right-sided adenopathy is present. A benign
jugulodigastric lymph node is present on the right.

Vascular: No focal vascular lesions are present.

Limited intracranial: Within normal limits.

Visualized orbits: Not imaged

Mastoids and visualized paranasal sinuses: The visualized maxillary
sinuses, sphenoid sinuses, and mastoid air cells are clear.

Skeleton: Anterior and posterior fusion at C2-3 and C[DATE] be
congenital. Endplate change in uncovertebral spurring is present
C5-6 and C6-7.

Upper chest: Mild dependent atelectasis is present in the upper
lobes bilaterally. The superior mediastinum is unremarkable.
IMPRESSION: 1. 3.5 x 2.3 x 2.8 cm heterogeneous left anterior level 2 lymph node
is concerning for metastatic disease, likely of a primary squamous
cell neoplasm neck in the neck.
2. 2 smaller level 3 lymph nodes are also concerning for metastatic
disease.
3. Asymmetric mucosal tissue on the left at the glossotonsillar
sulcus and extending into the vallecula raises concern for possible
primary site of tumor. Recommend ENT consultation for further
evaluation.

## 2019-04-16 ENCOUNTER — Ambulatory Visit: Payer: Self-pay | Attending: Internal Medicine

## 2019-04-16 DIAGNOSIS — Z23 Encounter for immunization: Secondary | ICD-10-CM

## 2019-05-10 ENCOUNTER — Ambulatory Visit: Payer: Self-pay | Attending: Internal Medicine

## 2019-05-10 DIAGNOSIS — Z23 Encounter for immunization: Secondary | ICD-10-CM

## 2019-05-10 NOTE — Progress Notes (Signed)
   Covid-19 Vaccination Clinic  Name:  Estefano Crombie    MRN: [id number] DOB: [birth year]  05/10/2019  Mr. Postiglione was observed post Covid-19 immunization for 15 minutes without incident. He was provided with Vaccine Information Sheet and instruction to access the V-Safe system.   Mr. Clemmons was instructed to call 911 with any severe reactions post vaccine: Marland Kitchen Difficulty breathing  . Swelling of face and throat  . A fast heartbeat  . A bad rash all over body  . Dizziness and weakness   Immunizations Administered    Name Date Dose VIS Date Route   Pfizer COVID-19 Vaccine 05/10/2019  4:26 PM 0.3 mL 03/10/2018 Intramuscular   Manufacturer: [place]   Lot: [id number]   Jet: [id number]

## 2019-05-12 DIAGNOSIS — L2084 Intrinsic (allergic) eczema: Secondary | ICD-10-CM | POA: Diagnosis not present

## 2019-05-12 DIAGNOSIS — D225 Melanocytic nevi of trunk: Secondary | ICD-10-CM | POA: Diagnosis not present

## 2019-05-12 DIAGNOSIS — D1801 Hemangioma of skin and subcutaneous tissue: Secondary | ICD-10-CM | POA: Diagnosis not present

## 2019-05-12 DIAGNOSIS — L570 Actinic keratosis: Secondary | ICD-10-CM | POA: Diagnosis not present

## 2019-05-12 DIAGNOSIS — D485 Neoplasm of uncertain behavior of skin: Secondary | ICD-10-CM | POA: Diagnosis not present

## 2019-05-12 DIAGNOSIS — B079 Viral wart, unspecified: Secondary | ICD-10-CM | POA: Diagnosis not present

## 2019-05-12 DIAGNOSIS — L814 Other melanin hyperpigmentation: Secondary | ICD-10-CM | POA: Diagnosis not present

## 2019-05-19 ENCOUNTER — Other Ambulatory Visit: Payer: Self-pay | Admitting: Internal Medicine

## 2019-05-19 NOTE — Telephone Encounter (Signed)
Please refill as per office routine med refill policy (all routine meds refilled for 3 mo or monthly per pt preference up to one year from last visit, then month to month grace period for 3 mo, then further med refills will have to be denied)  

## 2019-06-02 DIAGNOSIS — M25511 Pain in right shoulder: Secondary | ICD-10-CM | POA: Diagnosis not present

## 2019-06-05 DIAGNOSIS — M25511 Pain in right shoulder: Secondary | ICD-10-CM | POA: Diagnosis not present

## 2019-06-11 DIAGNOSIS — M25511 Pain in right shoulder: Secondary | ICD-10-CM | POA: Diagnosis not present

## 2019-06-12 ENCOUNTER — Other Ambulatory Visit: Payer: Self-pay | Admitting: Internal Medicine

## 2019-06-12 NOTE — Telephone Encounter (Signed)
Please refill as per office routine med refill policy (all routine meds refilled for 3 mo or monthly per pt preference up to one year from last visit, then month to month grace period for 3 mo, then further med refills will have to be denied)  

## 2019-06-21 ENCOUNTER — Telehealth: Payer: Self-pay | Admitting: Internal Medicine

## 2019-06-21 NOTE — Telephone Encounter (Signed)
Recv'd records from [facility] forwarded to Dr. Cathlean Cower 6/7/21fbg

## 2019-06-23 ENCOUNTER — Other Ambulatory Visit: Payer: Self-pay

## 2019-06-23 ENCOUNTER — Encounter: Payer: Self-pay | Admitting: Internal Medicine

## 2019-06-23 ENCOUNTER — Ambulatory Visit (INDEPENDENT_AMBULATORY_CARE_PROVIDER_SITE_OTHER): Payer: BC Managed Care – PPO | Admitting: Internal Medicine

## 2019-06-23 VITALS — BP 122/80 | HR 66 | Temp 98.8°F | Ht 73.0 in | Wt 204.0 lb

## 2019-06-23 DIAGNOSIS — Z86010 Personal history of colonic polyps: Secondary | ICD-10-CM | POA: Diagnosis not present

## 2019-06-23 DIAGNOSIS — E559 Vitamin D deficiency, unspecified: Secondary | ICD-10-CM | POA: Diagnosis not present

## 2019-06-23 DIAGNOSIS — E538 Deficiency of other specified B group vitamins: Secondary | ICD-10-CM | POA: Diagnosis not present

## 2019-06-23 DIAGNOSIS — Z0000 Encounter for general adult medical examination without abnormal findings: Secondary | ICD-10-CM | POA: Diagnosis not present

## 2019-06-23 NOTE — Patient Instructions (Signed)
Plesae consider the Shingles vaccine called Shingrix here or at your local pharmacy  You will be contacted regarding the referral for: colonoscopy  Please continue all other medications as before, and refills have been done if requested.  Please have the pharmacy call with any other refills you may need.  Please continue your efforts at being more active, low cholesterol diet, and weight control.  You are otherwise up to date with prevention measures today.  Please keep your appointments with your specialists as you may have planned  Please go to the LAB at the blood drawing area for the tests to be done  You will be contacted by phone if any changes need to be made immediately.  Otherwise, you will receive a letter about your results with an explanation, but please check with MyChart first.  Please remember to sign up for MyChart if you have not done so, as this will be important to you in the future with finding out test results, communicating by private email, and scheduling acute appointments online when needed.  Please make an Appointment to return for your 1 year visit, or sooner if needed

## 2019-06-23 NOTE — Progress Notes (Signed)
Subjective:    Patient ID: Bradley Roberts, male    DOB: 1956-01-14, 64 y.o.   MRN: [id number]  HPI   Here for wellness and f/u;  Overall doing ok;  Pt denies Chest pain, worsening SOB, DOE, wheezing, orthopnea, PND, worsening LE edema, palpitations, dizziness or syncope.  Pt denies neurological change such as new headache, facial or extremity weakness.  Pt denies polydipsia, polyuria, or low sugar symptoms. Pt states overall good compliance with treatment and medications, good tolerability, and has been trying to follow appropriate diet.  Pt denies worsening depressive symptoms, suicidal ideation or panic. No fever, night sweats, wt loss, loss of appetite, or other constitutional symptoms.  Pt states good ability with ADL's, has low fall risk, home safety reviewed and adequate, no other significant changes in hearing or vision, and only occasionally active with exercise. For July 9 RUE surgury per Dr Rhona Raider for prox bicep tendon rupture and chronic rot cuff tearing.  Due for f/u colonoscopy Past Medical History:  Diagnosis Date  . ALLERGIC RHINITIS 11/03/2006  . ANXIETY 09/15/2006  . 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 GERD 09/15/2006  . Hx of adenomatous polyp of rectum 02/13/2016  . HYPERLIPIDEMIA 09/13/2006  . OBSTRUCTIVE SLEEP APNEA 09/13/2006   test about 2002-mild-mod-did not use cpap-  . Seizures ([place])    first seizure was 10/26/2015/ pt is followed by Dr. Arlyn Leak at [facility], RIGHT 11/03/2006  . Sleep apnea    on c-pap  . TESTICULAR MASS 11/03/2009   Past Surgical History:  Procedure Laterality Date  . ARTHROSCOPIC REPAIR ACL  2008   rt  . INGUINAL HERNIA REPAIR Right 06/26/2012   Procedure: HERNIA REPAIR INGUINAL ADULT;  Surgeon: Earnstine Regal, MD;  Location: [facility];  Service: General;  Laterality: Right;  . inguinal herniorrhapy     age [age] both [age] and [age]  . INSERTION OF MESH Right 06/26/2012   Procedure: INSERTION OF MESH;  Surgeon: Earnstine Regal,  MD;  Location: [facility];  Service: General;  Laterality: Right;  . RENAL BIOPSY  1979   lt-  . TONSILLECTOMY    . WISDOM TOOTH EXTRACTION      reports that he has never smoked. He has never used smokeless tobacco. He reports current alcohol use of about 3.0 - 4.0 standard drinks of alcohol per week. He reports that he does not use drugs. family history includes Diabetes in his paternal grandmother; Heart attack in his father, maternal grandfather, and another family member; Liver cancer in his mother; Lung cancer in his mother. No Known Allergies Current Outpatient Medications on File Prior to Visit  Medication Sig Dispense Refill  . esomeprazole (NEXIUM) 20 MG capsule Take 20 mg by mouth daily at 12 noon.    . Oxcarbazepine (TRILEPTAL) 300 MG tablet Take 300 mg by mouth 2 (two) times daily.    . rosuvastatin (CRESTOR) 20 MG tablet TAKE 1 TABLET BY MOUTH EVERY DAY ANNUAL APPT DUE IN JUNE MUST SEE PROVIDER FOR FUTURE REFILLS 30 tablet 0   No current facility-administered medications on file prior to visit.   Review of Systems All otherwise neg per pt     Objective:   Physical Exam BP 122/80 (BP Location: Left Arm, Patient Position: Sitting, Cuff Size: Large)   Pulse 66   Temp 98.8 F (37.1 C) (Oral)   Ht 6\' 1"  (1.854 m)   Wt 204 lb (92.5 kg)   SpO2 96%   BMI  26.91 kg/m  VS noted,  Constitutional: Pt appears in NAD HENT: Head: NCAT.  Right Ear: External ear normal.  Left Ear: External ear normal.  Eyes: . Pupils are equal, round, and reactive to light. Conjunctivae and EOM are normal Nose: without d/c or deformity Neck: Neck supple. Gross normal ROM Cardiovascular: Normal rate and regular rhythm.   Pulmonary/Chest: Effort normal and breath sounds without rales or wheezing.  Abd:  Soft, NT, ND, + BS, no organomegaly Neurological: Pt is alert. At baseline orientation, motor grossly intact Skin: Skin is warm. No rashes, other new lesions, no LE  edema Psychiatric: Pt behavior is normal without agitation  All otherwise neg per pt  Lab Results  Component Value Date   WBC 8.3 06/23/2019   HGB 14.8 06/23/2019   HCT 43.1 06/23/2019   PLT 184.0 06/23/2019   GLUCOSE 91 06/23/2019   CHOL 157 06/23/2019   TRIG 172.0 (H) 06/23/2019   HDL 31.80 (L) 06/23/2019   LDLDIRECT 150.0 11/07/2014   LDLCALC 91 06/23/2019   ALT 30 06/23/2019   AST 16 06/23/2019   NA 138 06/23/2019   K 4.2 06/23/2019   CL 104 06/23/2019   CREATININE 1.09 06/23/2019   BUN 18 06/23/2019   CO2 28 06/23/2019   TSH 2.52 06/23/2019   PSA 4.46 (H) 06/23/2019      Assessment & Plan:

## 2019-06-24 LAB — HEPATIC FUNCTION PANEL
ALT: 30 U/L (ref 0–53)
AST: 16 U/L (ref 0–37)
Albumin: 4.6 g/dL (ref 3.5–5.2)
Alkaline Phosphatase: 71 U/L (ref 39–117)
Bilirubin, Direct: 0.1 mg/dL (ref 0.0–0.3)
Total Bilirubin: 0.4 mg/dL (ref 0.2–1.2)
Total Protein: 6.7 g/dL (ref 6.0–8.3)

## 2019-06-24 LAB — LIPID PANEL
Cholesterol: 157 mg/dL (ref 0–200)
HDL: 31.8 mg/dL — ABNORMAL LOW (ref >39.00)
LDL Cholesterol: 91 mg/dL (ref 0–99)
NonHDL: 125.62
Total CHOL/HDL Ratio: 5
Triglycerides: 172 mg/dL — ABNORMAL HIGH (ref 0.0–149.0)
VLDL: 34.4 mg/dL (ref 0.0–40.0)

## 2019-06-24 LAB — VITAMIN D 25 HYDROXY (VIT D DEFICIENCY, FRACTURES): VITD: 17.31 ng/mL — ABNORMAL LOW (ref 30.00–100.00)

## 2019-06-24 LAB — BASIC METABOLIC PANEL
BUN: 18 mg/dL (ref 6–23)
CO2: 28 mEq/L (ref 19–32)
Calcium: 8.9 mg/dL (ref 8.4–10.5)
Chloride: 104 mEq/L (ref 96–112)
Creatinine, Ser: 1.09 mg/dL (ref 0.40–1.50)
GFR: 68.45 mL/min (ref >60.00)
Glucose, Bld: 91 mg/dL (ref 70–99)
Potassium: 4.2 mEq/L (ref 3.5–5.1)
Sodium: 138 mEq/L (ref 135–145)

## 2019-06-24 LAB — URINALYSIS, ROUTINE W REFLEX MICROSCOPIC
Bilirubin Urine: NEGATIVE
Hgb urine dipstick: NEGATIVE
Ketones, ur: NEGATIVE
Leukocytes,Ua: NEGATIVE
Nitrite: NEGATIVE
RBC / HPF: NONE SEEN (ref 0–?)
Specific Gravity, Urine: >=1.03 — AB (ref 1.000–1.030)
Total Protein, Urine: NEGATIVE
Urine Glucose: NEGATIVE
Urobilinogen, UA: 0.2 (ref 0.0–1.0)
pH: 5.5 (ref 5.0–8.0)

## 2019-06-24 LAB — CBC WITH DIFFERENTIAL/PLATELET
Basophils Absolute: 0.1 10*3/uL (ref 0.0–0.1)
Basophils Relative: 0.8 % (ref 0.0–3.0)
Eosinophils Absolute: 0.1 10*3/uL (ref 0.0–0.7)
Eosinophils Relative: 1.1 % (ref 0.0–5.0)
HCT: 43.1 % (ref 39.0–52.0)
Hemoglobin: 14.8 g/dL (ref 13.0–17.0)
Lymphocytes Relative: 26.6 % (ref 12.0–46.0)
Lymphs Abs: 2.2 10*3/uL (ref 0.7–4.0)
MCHC: 34.3 g/dL (ref 30.0–36.0)
MCV: 95.9 fl (ref 78.0–100.0)
Monocytes Absolute: 0.8 10*3/uL (ref 0.1–1.0)
Monocytes Relative: 10.1 % (ref 3.0–12.0)
Neutro Abs: 5.1 10*3/uL (ref 1.4–7.7)
Neutrophils Relative %: 61.4 % (ref 43.0–77.0)
Platelets: 184 10*3/uL (ref 150.0–400.0)
RBC: 4.49 Mil/uL (ref 4.22–5.81)
RDW: 13.2 % (ref 11.5–15.5)
WBC: 8.3 10*3/uL (ref 4.0–10.5)

## 2019-06-24 LAB — TSH: TSH: 2.52 u[IU]/mL (ref 0.35–4.50)

## 2019-06-24 LAB — PSA: PSA: 4.46 ng/mL — ABNORMAL HIGH (ref 0.10–4.00)

## 2019-06-24 LAB — VITAMIN B12: Vitamin B-12: 226 pg/mL (ref 211–911)

## 2019-06-25 ENCOUNTER — Encounter: Payer: Self-pay | Admitting: Internal Medicine

## 2019-06-26 ENCOUNTER — Other Ambulatory Visit: Payer: Self-pay | Admitting: Internal Medicine

## 2019-06-26 MED ORDER — VITAMIN D (ERGOCALCIFEROL) 1.25 MG (50000 UNIT) PO CAPS: 50000.0000 [IU] | ORAL_CAPSULE | ORAL | 0 refills | Status: DC

## 2019-06-27 ENCOUNTER — Encounter: Payer: Self-pay | Admitting: Internal Medicine

## 2019-06-27 NOTE — Assessment & Plan Note (Signed)

## 2019-06-29 ENCOUNTER — Encounter: Payer: Self-pay | Admitting: Internal Medicine

## 2019-07-15 ENCOUNTER — Other Ambulatory Visit: Payer: Self-pay | Admitting: Internal Medicine

## 2019-07-15 NOTE — Telephone Encounter (Signed)
Please refill as per office routine med refill policy (all routine meds refilled for 3 mo or monthly per pt preference up to one year from last visit, then month to month grace period for 3 mo, then further med refills will have to be denied)  

## 2019-07-22 DIAGNOSIS — M75121 Complete rotator cuff tear or rupture of right shoulder, not specified as traumatic: Secondary | ICD-10-CM | POA: Diagnosis not present

## 2019-07-22 DIAGNOSIS — G8918 Other acute postprocedural pain: Secondary | ICD-10-CM | POA: Diagnosis not present

## 2019-07-22 DIAGNOSIS — M85811 Other specified disorders of bone density and structure, right shoulder: Secondary | ICD-10-CM | POA: Diagnosis not present

## 2019-08-20 ENCOUNTER — Other Ambulatory Visit: Payer: Self-pay

## 2019-08-20 ENCOUNTER — Ambulatory Visit (AMBULATORY_SURGERY_CENTER): Payer: Self-pay

## 2019-08-20 VITALS — Ht 73.0 in | Wt 208.0 lb

## 2019-08-20 DIAGNOSIS — Z86010 Personal history of colonic polyps: Secondary | ICD-10-CM

## 2019-08-20 NOTE — Progress Notes (Signed)
No egg or soy allergy known to patient  No issues with past sedation with any surgeries or procedures No intubation problems in the past  No FH of Malignant Hyperthermia No diet pills per patient No home 02 use per patient  No blood thinners per patient  Pt denies issues with constipation  No A fib or A flutter  EMMI video via MyChart  COVID 19 guidelines implemented in [facility] today with Pt and RN  COVID vaccines completed on 04/2019 per pt;  Due to the COVID-19 pandemic we are asking patients to follow these guidelines. Please only bring one care partner. Please be aware that your care partner may wait in the car in the parking lot or if they feel like they will be too hot to wait in the car, they may wait in the lobby on the 4th floor. All care partners are required to wear a mask the entire time (we do not have any that we can provide them), they need to practice social distancing, and we will do a Covid check for all patient's and care partners when you arrive. Also we will check their temperature and your temperature. If the care partner waits in their car they need to stay in the parking lot the entire time and we will call them on their cell phone when the patient is ready for discharge so they can bring the car to the front of the building. Also all patient's will need to wear a mask into building.  

## 2019-08-23 ENCOUNTER — Encounter: Payer: Self-pay | Admitting: Internal Medicine

## 2019-08-27 DIAGNOSIS — M75121 Complete rotator cuff tear or rupture of right shoulder, not specified as traumatic: Secondary | ICD-10-CM | POA: Diagnosis not present

## 2019-08-27 DIAGNOSIS — M25611 Stiffness of right shoulder, not elsewhere classified: Secondary | ICD-10-CM | POA: Diagnosis not present

## 2019-09-01 ENCOUNTER — Encounter: Payer: BC Managed Care – PPO | Admitting: Internal Medicine

## 2019-09-10 DIAGNOSIS — M25611 Stiffness of right shoulder, not elsewhere classified: Secondary | ICD-10-CM | POA: Diagnosis not present

## 2019-09-10 DIAGNOSIS — M75121 Complete rotator cuff tear or rupture of right shoulder, not specified as traumatic: Secondary | ICD-10-CM | POA: Diagnosis not present

## 2019-09-17 ENCOUNTER — Other Ambulatory Visit: Payer: Self-pay

## 2019-09-17 ENCOUNTER — Ambulatory Visit (AMBULATORY_SURGERY_CENTER): Payer: BC Managed Care – PPO | Admitting: Internal Medicine

## 2019-09-17 ENCOUNTER — Encounter: Payer: Self-pay | Admitting: Internal Medicine

## 2019-09-17 VITALS — BP 111/78 | HR 68 | Temp 98.0°F | Resp 13 | Ht 73.0 in | Wt 208.0 lb

## 2019-09-17 DIAGNOSIS — Z86010 Personal history of colonic polyps: Secondary | ICD-10-CM | POA: Diagnosis not present

## 2019-09-17 DIAGNOSIS — Z1211 Encounter for screening for malignant neoplasm of colon: Secondary | ICD-10-CM | POA: Diagnosis not present

## 2019-09-17 MED ORDER — SODIUM CHLORIDE 0.9 % IV SOLN: 500.0000 mL | Freq: Once | INTRAVENOUS | Status: DC

## 2019-09-17 NOTE — Progress Notes (Signed)
Pt's states no medical or surgical changes since previsit or office visit. VS by AB

## 2019-09-17 NOTE — Op Note (Signed)
[facility] Patient Name: Bradley Roberts Procedure Date: 09/17/2019 2:37 PM MRN: [id number] Endoscopist: Gatha Mayer , MD Age: 64 Referring MD:  Date of Birth: Feb 15, 1955 Gender: Male Account #: [id number] Procedure:                Colonoscopy Indications:              Surveillance: Personal history of adenomatous                            polyps on last colonoscopy 3 years ago Medicines:                Propofol per Anesthesia, Monitored Anesthesia Care Procedure:                Pre-Anesthesia Assessment:                           - Prior to the procedure, a History and Physical                            was performed, and patient medications and                            allergies were reviewed. The patient's tolerance of                            previous anesthesia was also reviewed. The risks                            and benefits of the procedure and the sedation                            options and risks were discussed with the patient.                            All questions were answered, and informed consent                            was obtained. Prior Anticoagulants: The patient has                            taken no previous anticoagulant or antiplatelet                            agents. ASA Grade Assessment: II - A patient with                            mild systemic disease. After reviewing the risks                            and benefits, the patient was deemed in                            satisfactory condition to undergo the procedure.  After obtaining informed consent, the colonoscope                            was passed under direct vision. Throughout the                            procedure, the patient's blood pressure, pulse, and                            oxygen saturations were monitored continuously. The                            Colonoscope was introduced through the anus and                            advanced  to the the cecum, identified by                            appendiceal orifice and ileocecal valve. The                            colonoscopy was performed without difficulty. The                            patient tolerated the procedure well. The quality                            of the bowel preparation was good. The ileocecal                            valve, appendiceal orifice, and rectum were                            photographed. The bowel preparation used was                            Miralax via split dose instruction. Scope In: 2:53:30 PM Scope Out: 3:11:03 PM Scope Withdrawal Time: 0 hours 11 minutes 13 seconds  Total Procedure Duration: 0 hours 17 minutes 33 seconds  Findings:                 The perianal and digital rectal examinations were                            normal.                           Multiple diverticula were found in the sigmoid                            colon and descending colon.                           The exam was otherwise without abnormality on  direct and retroflexion views. Complications:            No immediate complications. Estimated Blood Loss:     Estimated blood loss was minimal. Impression:               - Diverticulosis in the sigmoid colon and in the                            descending colon.                           - The examination was otherwise normal on direct                            and retroflexion views.                           - No specimens collected.                           - Personal history of colonic polyp 12 mm adenoma                            2018. Recommendation:           - Patient has a contact number available for                            emergencies. The signs and symptoms of potential                            delayed complications were discussed with the                            patient. Return to normal activities tomorrow.                            Written  discharge instructions were provided to the                            patient.                           - Resume previous diet.                           - Continue present medications.                           - Repeat colonoscopy in 5 years for surveillance. Gatha Mayer, MD 09/17/2019 3:18:30 PM This report has been signed electronically.

## 2019-09-17 NOTE — Progress Notes (Signed)
A/ox3, pleased with MAC, report to RN 

## 2019-09-17 NOTE — Patient Instructions (Addendum)
No polyps today!  Your next routine colonoscopy should be in 5 years - 2026.  I appreciate the opportunity to care for you. Gatha Mayer, MD, [facility]  Diverticulosis handout given to patient.  YOU HAD AN ENDOSCOPIC PROCEDURE TODAY AT [facility]:   Refer to the procedure report that was given to you for any specific questions about what was found during the examination.  If the procedure report does not answer your questions, please call your gastroenterologist to clarify.  If you requested that your care partner not be given the details of your procedure findings, then the procedure report has been included in a sealed envelope for you to review at your convenience later.  YOU SHOULD EXPECT: Some feelings of bloating in the abdomen. Passage of more gas than usual.  Walking can help get rid of the air that was put into your GI tract during the procedure and reduce the bloating. If you had a lower endoscopy (such as a colonoscopy or flexible sigmoidoscopy) you may notice spotting of blood in your stool or on the toilet paper. If you underwent a bowel prep for your procedure, you may not have a normal bowel movement for a few days.  Please Note:  You might notice some irritation and congestion in your nose or some drainage.  This is from the oxygen used during your procedure.  There is no need for concern and it should clear up in a day or so.  SYMPTOMS TO REPORT IMMEDIATELY:   Following lower endoscopy (colonoscopy or flexible sigmoidoscopy):  Excessive amounts of blood in the stool  Significant tenderness or worsening of abdominal pains  Swelling of the abdomen that is new, acute  Fever of 100F or higher For urgent or emergent issues, a gastroenterologist can be reached at any hour by calling [phone]. Do not use MyChart messaging for urgent concerns.    DIET:  We do recommend a small meal at first, but then you may proceed to your regular diet.  Drink plenty of  fluids but you should avoid alcoholic beverages for 24 hours.  ACTIVITY:  You should plan to take it easy for the rest of today and you should NOT DRIVE or use heavy machinery until tomorrow (because of the sedation medicines used during the test).    FOLLOW UP: Our staff will call the number listed on your records 48-72 hours following your procedure to check on you and address any questions or concerns that you may have regarding the information given to you following your procedure. If we do not reach you, we will leave a message.  We will attempt to reach you two times.  During this call, we will ask if you have developed any symptoms of COVID 19. If you develop any symptoms (ie: fever, flu-like symptoms, shortness of breath, cough etc.) before then, please call [phone].  If you test positive for Covid 19 in the 2 weeks post procedure, please call and report this information to [place].    If any biopsies were taken you will be contacted by phone or by letter within the next 1-3 weeks.  Please call us at [phone] if you have not heard about the biopsies in 3 weeks.    SIGNATURES/CONFIDENTIALITY: You and/or your care partner have signed paperwork which will be entered into your electronic medical record.  These signatures attest to the fact that that the information above on your After Visit Summary has been reviewed and is understood.  Full responsibility of the confidentiality of this discharge information lies with you and/or your care-partner.

## 2019-09-22 ENCOUNTER — Telehealth: Payer: Self-pay

## 2019-09-22 ENCOUNTER — Telehealth: Payer: Self-pay | Admitting: *Deleted

## 2019-09-22 NOTE — Telephone Encounter (Signed)
  Follow up Call-  Call back number 09/17/2019  Post procedure Call Back phone  # [phone]  Permission to leave phone message Yes  Some recent data might be hidden     Patient questions:  Do you have a fever, pain , or abdominal swelling? No. Pain Score  0 *  Have you tolerated food without any problems? Yes.    Have you been able to return to your normal activities? Yes.    Do you have any questions about your discharge instructions: Diet   No. Medications  No. Follow up visit  No.  Do you have questions or concerns about your Care? No.  Actions: * If pain score is 4 or above: No action needed, pain <4.  1. Have you developed a fever since your procedure? no  2.   Have you had an respiratory symptoms (SOB or cough) since your procedure? no  3.   Have you tested positive for COVID 19 since your procedure no  4.   Have you had any family members/close contacts diagnosed with the COVID 19 since your procedure?  no   If yes to any of these questions please route to Joylene John, RN and Joella Prince, RN

## 2019-09-22 NOTE — Telephone Encounter (Signed)
Called [phone] and left a message we tried to reach pt for a follow up call. maw

## 2019-09-24 DIAGNOSIS — M75121 Complete rotator cuff tear or rupture of right shoulder, not specified as traumatic: Secondary | ICD-10-CM | POA: Diagnosis not present

## 2019-09-24 DIAGNOSIS — M25611 Stiffness of right shoulder, not elsewhere classified: Secondary | ICD-10-CM | POA: Diagnosis not present

## 2019-10-15 DIAGNOSIS — M25611 Stiffness of right shoulder, not elsewhere classified: Secondary | ICD-10-CM | POA: Diagnosis not present

## 2019-10-15 DIAGNOSIS — M75121 Complete rotator cuff tear or rupture of right shoulder, not specified as traumatic: Secondary | ICD-10-CM | POA: Diagnosis not present

## 2019-10-22 DIAGNOSIS — M75121 Complete rotator cuff tear or rupture of right shoulder, not specified as traumatic: Secondary | ICD-10-CM | POA: Diagnosis not present

## 2019-10-22 DIAGNOSIS — M25611 Stiffness of right shoulder, not elsewhere classified: Secondary | ICD-10-CM | POA: Diagnosis not present

## 2019-10-29 DIAGNOSIS — M75121 Complete rotator cuff tear or rupture of right shoulder, not specified as traumatic: Secondary | ICD-10-CM | POA: Diagnosis not present

## 2019-10-29 DIAGNOSIS — M25611 Stiffness of right shoulder, not elsewhere classified: Secondary | ICD-10-CM | POA: Diagnosis not present

## 2019-11-03 DIAGNOSIS — M75121 Complete rotator cuff tear or rupture of right shoulder, not specified as traumatic: Secondary | ICD-10-CM | POA: Diagnosis not present

## 2019-11-03 DIAGNOSIS — M25611 Stiffness of right shoulder, not elsewhere classified: Secondary | ICD-10-CM | POA: Diagnosis not present

## 2019-11-12 DIAGNOSIS — M25611 Stiffness of right shoulder, not elsewhere classified: Secondary | ICD-10-CM | POA: Diagnosis not present

## 2019-11-12 DIAGNOSIS — M75121 Complete rotator cuff tear or rupture of right shoulder, not specified as traumatic: Secondary | ICD-10-CM | POA: Diagnosis not present

## 2019-11-19 DIAGNOSIS — M25611 Stiffness of right shoulder, not elsewhere classified: Secondary | ICD-10-CM | POA: Diagnosis not present

## 2019-11-19 DIAGNOSIS — M75121 Complete rotator cuff tear or rupture of right shoulder, not specified as traumatic: Secondary | ICD-10-CM | POA: Diagnosis not present

## 2019-12-03 DIAGNOSIS — Z9889 Other specified postprocedural states: Secondary | ICD-10-CM | POA: Diagnosis not present

## 2020-02-11 DIAGNOSIS — D1801 Hemangioma of skin and subcutaneous tissue: Secondary | ICD-10-CM | POA: Diagnosis not present

## 2020-02-11 DIAGNOSIS — L570 Actinic keratosis: Secondary | ICD-10-CM | POA: Diagnosis not present

## 2020-02-11 DIAGNOSIS — D225 Melanocytic nevi of trunk: Secondary | ICD-10-CM | POA: Diagnosis not present

## 2020-02-11 DIAGNOSIS — L814 Other melanin hyperpigmentation: Secondary | ICD-10-CM | POA: Diagnosis not present

## 2020-02-11 DIAGNOSIS — L821 Other seborrheic keratosis: Secondary | ICD-10-CM | POA: Diagnosis not present

## 2020-02-16 DIAGNOSIS — Z9889 Other specified postprocedural states: Secondary | ICD-10-CM | POA: Diagnosis not present

## 2020-02-29 DIAGNOSIS — E785 Hyperlipidemia, unspecified: Secondary | ICD-10-CM | POA: Diagnosis not present

## 2020-02-29 DIAGNOSIS — Z79890 Hormone replacement therapy (postmenopausal): Secondary | ICD-10-CM | POA: Diagnosis not present

## 2020-02-29 DIAGNOSIS — C109 Malignant neoplasm of oropharynx, unspecified: Secondary | ICD-10-CM | POA: Diagnosis not present

## 2020-02-29 DIAGNOSIS — C760 Malignant neoplasm of head, face and neck: Secondary | ICD-10-CM | POA: Diagnosis not present

## 2020-02-29 DIAGNOSIS — G40909 Epilepsy, unspecified, not intractable, without status epilepticus: Secondary | ICD-10-CM | POA: Diagnosis not present

## 2020-02-29 DIAGNOSIS — Z79899 Other long term (current) drug therapy: Secondary | ICD-10-CM | POA: Diagnosis not present

## 2020-02-29 DIAGNOSIS — K219 Gastro-esophageal reflux disease without esophagitis: Secondary | ICD-10-CM | POA: Diagnosis not present

## 2020-04-06 DIAGNOSIS — G40109 Localization-related (focal) (partial) symptomatic epilepsy and epileptic syndromes with simple partial seizures, not intractable, without status epilepticus: Secondary | ICD-10-CM | POA: Diagnosis not present

## 2020-06-20 DIAGNOSIS — Z20822 Contact with and (suspected) exposure to covid-19: Secondary | ICD-10-CM | POA: Diagnosis not present

## 2020-06-22 ENCOUNTER — Telehealth (INDEPENDENT_AMBULATORY_CARE_PROVIDER_SITE_OTHER): Payer: BC Managed Care – PPO | Admitting: Family Medicine

## 2020-06-22 ENCOUNTER — Other Ambulatory Visit: Payer: Self-pay

## 2020-06-22 ENCOUNTER — Encounter: Payer: Self-pay | Admitting: Family Medicine

## 2020-06-22 DIAGNOSIS — U071 COVID-19: Secondary | ICD-10-CM | POA: Diagnosis not present

## 2020-06-22 MED ORDER — BENZONATATE 100 MG PO CAPS: 100.0000 mg | ORAL_CAPSULE | Freq: Three times a day (TID) | ORAL | 0 refills | Status: DC | PRN

## 2020-06-22 NOTE — Progress Notes (Signed)
Virtual Visit via Video Note  I connected with Lekendrick  on 06/22/20 at 12:20 PM EDT by a video enabled telemedicine application and verified that I am speaking with the correct person using two identifiers.  Location patient: home, [place] Location provider:work or home office Persons participating in the virtual visit: patient, provider  I discussed the limitations of evaluation and management by telemedicine and the availability of in person appointments. The patient expressed understanding and agreed to proceed.   HPI:  Acute telemedicine visit for Covid19: -Onset: 4 days ago; positive test at home and did a pcr test that was positive as well -Symptoms include: nasal congestion, cough, diarrhea, fever - now resolved -starting to feel better today -Denies: CP, SOB, NV, inability to eat/drink/get out of bed -Has tried: tylenol -Pertinent past medical history: see below -Pertinent medication allergies: No Known Allergies -COVID-19 vaccine status: has had 2 doses + booster -no labs in the last 90 days  ROS: See pertinent positives and negatives per HPI.  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    Past Surgical History:  Procedure Laterality Date   ARTHROSCOPIC REPAIR ACL  2008   right   COLONOSCOPY  2018   polyps   DEEP NECK LYMPH NODE BIOPSY / EXCISION Left 2018   along with tongue base cancel removal   INGUINAL HERNIA REPAIR Right 06/26/2012   Procedure:  HERNIA REPAIR INGUINAL ADULT;  Surgeon: Earnstine Regal, MD;  Location: [facility];  Service: General;  Laterality: Right;   inguinal herniorrhapy     age 42 and age 52 both rt and lt   INSERTION OF MESH Right 06/26/2012   Procedure: INSERTION OF MESH;  Surgeon: Earnstine Regal, MD;  Location: [facility];  Service: General;  Laterality: Right;   POLYPECTOMY  2018   RENAL BIOPSY Left 1979   TONGUE SURGERY  2018   left-tongue base cancel   [place] EXTRACTION  2011     Current Outpatient Medications:    benzonatate (TESSALON PERLES) 100 MG capsule, Take 1 capsule (100 mg total) by mouth 3 (three) times daily as needed., Disp: 20 capsule, Rfl: 0   esomeprazole (NEXIUM) 20 MG capsule, Take 20 mg by mouth as needed., Disp: , Rfl:    Oxcarbazepine (TRILEPTAL) 300 MG tablet, Take 300 mg by mouth 2 (two) times daily., Disp: , Rfl:    rosuvastatin (CRESTOR) 20 MG tablet, TAKE 1 TABLET BY MOUTH EVERY DAY, Disp: 90 tablet, Rfl: 3  EXAM:  VITALS per patient if applicable:  GENERAL: alert, oriented, appears well and in no acute distress  HEENT: atraumatic, conjunttiva clear, no obvious abnormalities on inspection of external nose and ears  NECK: normal movements of the head and neck  LUNGS: on inspection no signs of respiratory distress, breathing rate appears normal, no obvious gross SOB, gasping or wheezing  CV: no obvious cyanosis  MS: moves all visible extremities without noticeable abnormality  PSYCH/NEURO: pleasant and cooperative, no obvious depression or anxiety, speech and thought processing grossly intact  ASSESSMENT AND PLAN:  Discussed the following assessment and plan:  COVID-19   Discussed treatment options, ideal treatment window, potential complications, isolation and precautions for COVID-19.  After lengthy discussion, the patient declined referral for Covid outpatient treatment at this time. The patient did want a  prescription for cough, Tessalon Rx sent.  Other symptomatic care measures summarized in patient instructions. Work/School slipped offered: provided in patient instructions   Advised to seek prompt in person care if worsening, new symptoms arise, or if is not continuing to improved. Discussed options for inperson care if PCP office not available. Did let this patient know that I only do telemedicine on Tuesdays and Thursdays for Susan Moore. Advised to schedule follow up visit with PCP or [facility] if any further questions or concerns to avoid delays in care.   I discussed the assessment and treatment plan with the patient. The patient was provided an opportunity to ask questions and all were answered. The patient agreed with the plan and demonstrated an understanding of the instructions.     Lucretia Kern, DO

## 2020-06-22 NOTE — Patient Instructions (Addendum)
   ---------------------------------------------------------------------------------------------------------------------------      WORK SLIP:  Patient Bradley Roberts,  [birth year], was seen for a medical visit today, 06/22/20 . Please excuse from work for a COVID like illness. We advise 10 days minimum from the onset of symptoms (06/18/20) PLUS 1 day of no fever and improved symptoms. Will defer to employer for a sooner return to work if symptoms have resolved, it is greater than 5 days since the positive test and the patient can wear a high-quality, tight fitting mask such as N95 or KN95 at all times for an additional 5 days. Would also suggest COVID19 antigen testing is negative prior to return.  Sincerely: E-signature: Dr. Colin Benton, DO [place] Ph: [phone]   ------------------------------------------------------------------------------------------------------------------------------   HOME CARE TIPS:  -I sent the medication(s) we discussed to your pharmacy: No orders of the defined types were placed in this encounter.    -I sent in the [facility] treatment or referral you requested per our discussion. Please see the information provided below and discuss further with the pharmacist/treatment team.   -can use tylenol or aleve if needed for fevers, aches and pains per instructions  -can use nasal saline a few times per day if you have nasal congestion; sometimes  a short course of Afrin nasal spray for 3 days can help with symptoms as well  -stay hydrated, drink plenty of fluids and eat small healthy meals - avoid dairy  -can take 1000 IU (87mcg) Vit D3 and 100-500 mg of Vit C daily per instructions  -If the Covid test is positive, check out the [facility] website for more information on home care, transmission and treatment for COVID19  -follow up with your doctor in 2-3 days unless improving and feeling better  -stay home while sick, except to seek medical  care. If you have COVID19, ideally it would be best to stay home for a full 10 days since the onset of symptoms PLUS one day of no fever and feeling better. Wear a good mask that fits snugly (such as N95 or KN95) if around others to reduce the risk of transmission.  It was nice to meet you today, and I really hope you are feeling better soon. I help [facility] out with telemedicine visits on Tuesdays and Thursdays and am available for visits on those days. If you have any concerns or questions following this visit please schedule a follow up visit with your Primary Care doctor or seek care at a local urgent care clinic to avoid delays in care.    Seek in person care or schedule a follow up video visit promptly if your symptoms worsen, new concerns arise or you are not improving with treatment. Call 911 and/or seek emergency care if your symptoms are severe or life threatening.

## 2020-07-04 DIAGNOSIS — Z111 Encounter for screening for respiratory tuberculosis: Secondary | ICD-10-CM | POA: Diagnosis not present

## 2020-07-06 DIAGNOSIS — Z111 Encounter for screening for respiratory tuberculosis: Secondary | ICD-10-CM | POA: Diagnosis not present

## 2020-08-29 DIAGNOSIS — Z9989 Dependence on other enabling machines and devices: Secondary | ICD-10-CM | POA: Diagnosis not present

## 2020-08-29 DIAGNOSIS — Z79899 Other long term (current) drug therapy: Secondary | ICD-10-CM | POA: Diagnosis not present

## 2020-08-29 DIAGNOSIS — Z7289 Other problems related to lifestyle: Secondary | ICD-10-CM | POA: Diagnosis not present

## 2020-08-29 DIAGNOSIS — Z08 Encounter for follow-up examination after completed treatment for malignant neoplasm: Secondary | ICD-10-CM | POA: Diagnosis not present

## 2020-08-29 DIAGNOSIS — C109 Malignant neoplasm of oropharynx, unspecified: Secondary | ICD-10-CM | POA: Diagnosis not present

## 2020-08-29 DIAGNOSIS — K219 Gastro-esophageal reflux disease without esophagitis: Secondary | ICD-10-CM | POA: Diagnosis not present

## 2020-08-29 DIAGNOSIS — Z85810 Personal history of malignant neoplasm of tongue: Secondary | ICD-10-CM | POA: Diagnosis not present

## 2020-08-29 DIAGNOSIS — G4733 Obstructive sleep apnea (adult) (pediatric): Secondary | ICD-10-CM | POA: Diagnosis not present

## 2020-08-29 DIAGNOSIS — G40909 Epilepsy, unspecified, not intractable, without status epilepticus: Secondary | ICD-10-CM | POA: Diagnosis not present

## 2020-10-06 DIAGNOSIS — Z9989 Dependence on other enabling machines and devices: Secondary | ICD-10-CM | POA: Diagnosis not present

## 2020-10-06 DIAGNOSIS — R5383 Other fatigue: Secondary | ICD-10-CM | POA: Diagnosis not present

## 2020-10-06 DIAGNOSIS — K219 Gastro-esophageal reflux disease without esophagitis: Secondary | ICD-10-CM | POA: Diagnosis not present

## 2020-10-06 DIAGNOSIS — E785 Hyperlipidemia, unspecified: Secondary | ICD-10-CM | POA: Diagnosis not present

## 2020-10-06 DIAGNOSIS — G4733 Obstructive sleep apnea (adult) (pediatric): Secondary | ICD-10-CM | POA: Diagnosis not present

## 2020-10-06 DIAGNOSIS — G40909 Epilepsy, unspecified, not intractable, without status epilepticus: Secondary | ICD-10-CM | POA: Diagnosis not present

## 2020-10-06 DIAGNOSIS — Z86010 Personal history of colonic polyps: Secondary | ICD-10-CM | POA: Diagnosis not present

## 2020-10-06 DIAGNOSIS — Z85810 Personal history of malignant neoplasm of tongue: Secondary | ICD-10-CM | POA: Diagnosis not present

## 2020-10-11 DIAGNOSIS — G4733 Obstructive sleep apnea (adult) (pediatric): Secondary | ICD-10-CM | POA: Diagnosis not present

## 2020-12-13 ENCOUNTER — Other Ambulatory Visit: Payer: Self-pay | Admitting: Internal Medicine

## 2020-12-13 NOTE — Telephone Encounter (Signed)
Please refill as per office routine med refill policy (all routine meds to be refilled for 3 mo or monthly (per pt preference) up to one year from last visit, then month to month grace period for 3 mo, then further med refills will have to be denied) ? ?

## 2021-02-05 ENCOUNTER — Encounter: Payer: Self-pay | Admitting: Internal Medicine

## 2021-02-05 ENCOUNTER — Ambulatory Visit: Payer: BC Managed Care – PPO | Admitting: Internal Medicine

## 2021-02-05 ENCOUNTER — Other Ambulatory Visit: Payer: Self-pay

## 2021-02-05 VITALS — BP 114/76 | HR 60 | Temp 98.1°F | Ht 73.0 in | Wt 205.0 lb

## 2021-02-05 DIAGNOSIS — E559 Vitamin D deficiency, unspecified: Secondary | ICD-10-CM | POA: Diagnosis not present

## 2021-02-05 DIAGNOSIS — E538 Deficiency of other specified B group vitamins: Secondary | ICD-10-CM | POA: Diagnosis not present

## 2021-02-05 DIAGNOSIS — Z0001 Encounter for general adult medical examination with abnormal findings: Secondary | ICD-10-CM

## 2021-02-05 DIAGNOSIS — R972 Elevated prostate specific antigen [PSA]: Secondary | ICD-10-CM

## 2021-02-05 DIAGNOSIS — E7800 Pure hypercholesterolemia, unspecified: Secondary | ICD-10-CM

## 2021-02-05 DIAGNOSIS — K429 Umbilical hernia without obstruction or gangrene: Secondary | ICD-10-CM

## 2021-02-05 DIAGNOSIS — E785 Hyperlipidemia, unspecified: Secondary | ICD-10-CM | POA: Insufficient documentation

## 2021-02-05 DIAGNOSIS — R739 Hyperglycemia, unspecified: Secondary | ICD-10-CM

## 2021-02-05 MED ORDER — ROSUVASTATIN CALCIUM 20 MG PO TABS: ORAL_TABLET | ORAL | 3 refills | Status: DC

## 2021-02-05 NOTE — Assessment & Plan Note (Addendum)
Lab Results  Component Value Date   PSA 4.46 (H) 06/23/2019   PSA 5.60 (H) 06/22/2018   PSA 4.11 (H) 06/04/2017   Asympt, for f/u psa

## 2021-02-05 NOTE — Progress Notes (Signed)
Patient ID: Bradley Roberts, male   DOB: Nov 20, 1955, 66 y.o.   MRN: [id number]         Chief Complaint:: wellness exam and Follow-up  Umbilical hernia, hld, low vit d and B12       HPI:  Bradley Roberts is a 66 y.o. male here for wellness exam; declines shingrix, covid booster o/w up to date                        Also has ongoing medium sized umbilical hernia with slightly worsening intermittent mild discomfort locally but trying to hold off on any surgical consideration for now.  Taking 2000 u vit d sometimes and b12 1000.  Not taking statin but willing to restart.  Trying to follow lower chol diet.  Pt denies chest pain, increased sob or doe, wheezing, orthopnea, PND, increased LE swelling, palpitations, dizziness or syncope.   Pt denies polydipsia, polyuria, or new focal neuro s/s.   Pt denies fever, night sweats, loss of appetite, or other constitutional symptoms, though has lost several lbs recently intentionally.  Wt Readings from Last 3 Encounters:  02/05/21 205 lb (93 kg)  09/17/19 208 lb (94.3 kg)  08/20/19 208 lb (94.3 kg)   BP Readings from Last 3 Encounters:  02/05/21 114/76  09/17/19 111/78  06/23/19 122/80   Immunization History  Administered Date(s) Administered   Influenza Inj Mdck Quad Pf 10/01/2017   Influenza,inj,Quad PF,6+ Mos 12/17/2019   Influenza-Unspecified 10/06/2014, 09/29/2015, 10/23/2020   MMR 06/12/2004, 11/23/2018   PFIZER(Purple Top)SARS-COV-2 Vaccination 04/16/2019, 05/10/2019, 12/17/2019   PPD Test 06/25/2019, 07/04/2020   Td 06/12/2004   Tdap 06/04/2017   There are no preventive care reminders to display for this patient.     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   Past Surgical History:  Procedure Laterality Date   ARTHROSCOPIC REPAIR ACL  2008   right   COLONOSCOPY  2018   polyps   DEEP NECK LYMPH NODE BIOPSY / EXCISION Left 2018   along with tongue base cancel removal   INGUINAL HERNIA REPAIR Right 06/26/2012   Procedure: HERNIA REPAIR INGUINAL ADULT;  Surgeon: Earnstine Regal, MD;  Location: [facility];  Service: General;  Laterality: Right;   inguinal herniorrhapy     age 89 and age 44 both rt and lt   INSERTION OF MESH Right 06/26/2012   Procedure: INSERTION OF MESH;  Surgeon: Earnstine Regal, MD;  Location: [facility];  Service: General;  Laterality: Right;   POLYPECTOMY  2018   RENAL BIOPSY Left 1979   TONGUE SURGERY  2018   left-tongue base cancel   [place] EXTRACTION  2011    reports that he has never smoked. He has never used smokeless tobacco. He reports current alcohol use of about 3.0 - 4.0 standard drinks  per week. He reports that he does not use drugs. family history includes Brain cancer (age of onset: 38) in his mother; Diabetes in his paternal grandmother; Heart attack in his maternal grandfather and another family member; Heart attack (age of onset: 41) in his father; Liver cancer (age of onset: 58) in his mother; Lung cancer (age of onset: 2) in his mother. No Known Allergies Current Outpatient Medications on File Prior to Visit  Medication Sig Dispense Refill   Oxcarbazepine (TRILEPTAL) 300 MG tablet Take 300 mg by mouth 2 (two) times daily.     No current facility-administered medications on file prior to visit.        ROS:  All others reviewed and negative.  Objective        PE:  BP 114/76 (BP Location: Right Arm, Patient  Position: Sitting, Cuff Size: Normal)    Pulse 60    Temp 98.1 F (36.7 C) (Oral)    Ht '6\' 1"'  (1.854 m)    Wt 205 lb (93 kg)    SpO2 97%    BMI 27.05 kg/m                 Constitutional: Pt appears in NAD               HENT: Head: NCAT.                Right Ear: External ear normal.                 Left Ear: External ear normal.                Eyes: . Pupils are equal, round, and reactive to light. Conjunctivae and EOM are normal               Nose: without d/c or deformity               Neck: Neck supple. Gross normal ROM               Cardiovascular: Normal rate and regular rhythm.                 Pulmonary/Chest: Effort normal and breath sounds without rales or wheezing.                Abd:  Soft, NT, ND, + BS, no organomegaly               Neurological: Pt is alert. At baseline orientation, motor grossly intact               Skin: Skin is warm. No rashes, no other new lesions, LE edema - none               Psychiatric: Pt behavior is normal without agitation   Micro: none  Cardiac tracings I have personally interpreted today:  none  Pertinent Radiological findings (summarize): none   Lab Results  Component Value Date   WBC 8.3 06/23/2019   HGB 14.8 06/23/2019   HCT 43.1 06/23/2019   PLT 184.0 06/23/2019   GLUCOSE 91 06/23/2019   CHOL 157 06/23/2019   TRIG 172.0 (H) 06/23/2019   HDL 31.80 (L) 06/23/2019   LDLDIRECT 150.0 11/07/2014   LDLCALC 91 06/23/2019   ALT 30 06/23/2019   AST 16 06/23/2019   NA 138 06/23/2019   K 4.2 06/23/2019   CL 104 06/23/2019   CREATININE 1.09 06/23/2019   BUN 18 06/23/2019   CO2 28 06/23/2019  TSH 2.52 06/23/2019   PSA 4.46 (H) 06/23/2019   Assessment/Plan:  Bradley Roberts is a 66 y.o. White or Caucasian [1] male with  has a past medical history of ALLERGIC RHINITIS (11/03/2006), ANXIETY (09/15/2006), Cancer ([place]), EPIDIDYMO-ORCHITIS (11/03/2009), GERD (09/15/2006), Hx of adenomatous polyp of rectum (02/13/2016), HYPERLIPIDEMIA (09/13/2006),  OBSTRUCTIVE SLEEP APNEA (09/13/2006), Seizures ([place]), SHOULDER PAIN, RIGHT (11/03/2006), Sleep apnea (2002), TESTICULAR MASS (11/03/2009), and Tongue cancer ([place]) (2018).  Elevated PSA Lab Results  Component Value Date   PSA 4.46 (H) 06/23/2019   PSA 5.60 (H) 06/22/2018   PSA 4.11 (H) 06/04/2017   Asympt, for f/u psa  HLD (hyperlipidemia) Lab Results  Component Value Date   LDLCALC 91 06/23/2019   Pt to restart statin crestor   Encounter for well adult exam with abnormal findings Age and sex appropriate education and counseling updated with regular exercise and diet Referrals for preventative services - none needed Immunizations addressed - declines shingrix and covid booster Smoking counseling  - none needed Evidence for depression or other mood disorder - none significant Most recent labs reviewed. I have personally reviewed and have noted: 1) the patient's medical and social history 2) The patient's current medications and supplements 3) The patient's height, weight, and BMI have been recorded in the chart   Umbilical hernia With minor increased intermittent discomfort per pt, declines general surgury referral for now  Vitamin D deficiency Last vitamin D Lab Results  Component Value Date   VD25OH 17.31 (L) 06/23/2019   Low, to start oral replacement   B12 deficiency Lab Results  Component Value Date   VITAMINB12 226 06/23/2019   Low, to start oral replacement - b12 1000 mcg qd  Followup: No follow-ups on file.  Cathlean Cower, MD 02/11/2021 1:52 PM [facility]

## 2021-02-05 NOTE — Assessment & Plan Note (Addendum)
Lab Results  Component Value Date   LDLCALC 91 06/23/2019   Pt to restart statin crestor

## 2021-02-05 NOTE — Patient Instructions (Addendum)
Please have the Shingles shot done if covered with the insurance  Please plan to have lab work done at the [facility] lab (no appt) after Mar 14, 2021  You will be contacted by phone if any changes need to be made immediately.  Otherwise, you will receive a letter about your results with an explanation, but please check with MyChart first  Please take OTC Vitamin D3 at 4000 units per day, indefinitely  Please take OTC B12 1000 mcg per day for 6 months  Please continue all other medications as before, and refills have been done if requested - the crestor  Please call if the hernia pain is worsening, for general surgury referral  Please have the pharmacy call with any other refills you may need.  Please continue your efforts at being more active, low cholesterol diet, and weight control.  You are otherwise up to date with prevention measures today.  Please keep your appointments with your specialists as you may have planned  Please remember to sign up for MyChart if you have not done so, as this will be important to you in the future with finding out test results, communicating by private email, and scheduling acute appointments online when needed.  Please make an Appointment to return for your 1 year visit, or sooner if needed, with Lab testing by Appointment as well, to be done about 3-5 days before at the [facility] (so this is for TWO appointments - please see the scheduling desk as you leave)   Due to the ongoing Covid 19 pandemic, our lab now requires an appointment for any labs done at our office.  If you need labs done and do not have an appointment, please call our office ahead of time to schedule before presenting to the lab for your testing.

## 2021-02-09 DIAGNOSIS — L814 Other melanin hyperpigmentation: Secondary | ICD-10-CM | POA: Diagnosis not present

## 2021-02-09 DIAGNOSIS — D225 Melanocytic nevi of trunk: Secondary | ICD-10-CM | POA: Diagnosis not present

## 2021-02-09 DIAGNOSIS — D2271 Melanocytic nevi of right lower limb, including hip: Secondary | ICD-10-CM | POA: Diagnosis not present

## 2021-02-09 DIAGNOSIS — L821 Other seborrheic keratosis: Secondary | ICD-10-CM | POA: Diagnosis not present

## 2021-02-09 DIAGNOSIS — L570 Actinic keratosis: Secondary | ICD-10-CM | POA: Diagnosis not present

## 2021-02-11 DIAGNOSIS — E538 Deficiency of other specified B group vitamins: Secondary | ICD-10-CM | POA: Insufficient documentation

## 2021-02-11 DIAGNOSIS — K429 Umbilical hernia without obstruction or gangrene: Secondary | ICD-10-CM | POA: Insufficient documentation

## 2021-02-11 DIAGNOSIS — E559 Vitamin D deficiency, unspecified: Secondary | ICD-10-CM | POA: Insufficient documentation

## 2021-02-11 NOTE — Assessment & Plan Note (Signed)
Last vitamin D Lab Results  Component Value Date   VD25OH 17.31 (L) 06/23/2019   Low, to start oral replacement

## 2021-02-11 NOTE — Assessment & Plan Note (Signed)
Lab Results  Component Value Date   VITAMINB12 226 06/23/2019   Low, to start oral replacement - b12 1000 mcg qd

## 2021-02-11 NOTE — Assessment & Plan Note (Signed)
With minor increased intermittent discomfort per pt, declines general surgury referral for now

## 2021-02-11 NOTE — Assessment & Plan Note (Signed)
Age and sex appropriate education and counseling updated with regular exercise and diet °Referrals for preventative services - none needed °Immunizations addressed - declines shingrix and covid booster °Smoking counseling  - none needed °Evidence for depression or other mood disorder - none significant °Most recent labs reviewed. °I have personally reviewed and have noted: °1) the patient's medical and social history °2) The patient's current medications and supplements °3) The patient's height, weight, and BMI have been recorded in the chart ° °

## 2021-02-15 ENCOUNTER — Encounter: Payer: BC Managed Care – PPO | Admitting: Internal Medicine

## 2021-02-27 DIAGNOSIS — F32A Depression, unspecified: Secondary | ICD-10-CM | POA: Diagnosis not present

## 2021-02-27 DIAGNOSIS — Z6826 Body mass index (BMI) 26.0-26.9, adult: Secondary | ICD-10-CM | POA: Diagnosis not present

## 2021-02-27 DIAGNOSIS — E785 Hyperlipidemia, unspecified: Secondary | ICD-10-CM | POA: Diagnosis not present

## 2021-02-27 DIAGNOSIS — M4313 Spondylolisthesis, cervicothoracic region: Secondary | ICD-10-CM | POA: Diagnosis not present

## 2021-02-27 DIAGNOSIS — C109 Malignant neoplasm of oropharynx, unspecified: Secondary | ICD-10-CM | POA: Diagnosis not present

## 2021-02-27 DIAGNOSIS — K219 Gastro-esophageal reflux disease without esophagitis: Secondary | ICD-10-CM | POA: Diagnosis not present

## 2021-02-27 DIAGNOSIS — J341 Cyst and mucocele of nose and nasal sinus: Secondary | ICD-10-CM | POA: Diagnosis not present

## 2021-02-27 DIAGNOSIS — J9811 Atelectasis: Secondary | ICD-10-CM | POA: Diagnosis not present

## 2021-02-27 DIAGNOSIS — Z08 Encounter for follow-up examination after completed treatment for malignant neoplasm: Secondary | ICD-10-CM | POA: Diagnosis not present

## 2021-02-27 DIAGNOSIS — G40909 Epilepsy, unspecified, not intractable, without status epilepticus: Secondary | ICD-10-CM | POA: Diagnosis not present

## 2021-03-15 ENCOUNTER — Telehealth (INDEPENDENT_AMBULATORY_CARE_PROVIDER_SITE_OTHER): Payer: BC Managed Care – PPO | Admitting: Family Medicine

## 2021-03-15 ENCOUNTER — Encounter: Payer: Self-pay | Admitting: Family Medicine

## 2021-03-15 DIAGNOSIS — R059 Cough, unspecified: Secondary | ICD-10-CM

## 2021-03-15 DIAGNOSIS — R4589 Other symptoms and signs involving emotional state: Secondary | ICD-10-CM | POA: Diagnosis not present

## 2021-03-15 DIAGNOSIS — R062 Wheezing: Secondary | ICD-10-CM

## 2021-03-15 MED ORDER — ALBUTEROL SULFATE HFA 108 (90 BASE) MCG/ACT IN AERS: 2.0000 | INHALATION_SPRAY | Freq: Four times a day (QID) | RESPIRATORY_TRACT | 0 refills | Status: DC | PRN

## 2021-03-15 MED ORDER — BENZONATATE 100 MG PO CAPS: ORAL_CAPSULE | ORAL | 0 refills | Status: DC

## 2021-03-15 MED ORDER — DOXYCYCLINE HYCLATE 100 MG PO TABS: 100.0000 mg | ORAL_TABLET | Freq: Two times a day (BID) | ORAL | 0 refills | Status: DC

## 2021-03-15 NOTE — Progress Notes (Signed)
Virtual Visit via Video Note ? ?I connected with Bradley Roberts ? on 03/15/21 at 10:20 AM EST by a video enabled telemedicine application and verified that I am speaking with the correct person using two identifiers. ? Location patient: Bradley Roberts ?Location provider:work or home office ?Persons participating in the virtual visit: patient, provider ? ?I discussed the limitations and requested verbal permission for telemedicine visit. The patient expressed understanding and agreed to proceed. ? ? ?HPI: ? ?Acute telemedicine visit for cough and congestion: ?-Onset: 10 days a least ?-Symptoms include: started out with a "cold", now seems is worsening, thick white sputum and the last few days mucus is green and "feels feverish at night", feels like has a "gurgle" in the chest at times, occ wheezing ?-did 2 covid tests last week which were negative ?-Denies: CP, SOB, NVD ?-a friend has been sick as well ?-Pertinent past medical history: see below ?-Pertinent medication allergies:No Known Allergies ?-COVID-19 vaccine status:  ?Immunization History  ?Administered Date(s) Administered  ? Influenza Inj Mdck Quad Pf 10/01/2017  ? Influenza,inj,Quad PF,6+ Mos 12/17/2019  ? Influenza-Unspecified 10/06/2014, 09/29/2015, 10/23/2020  ? MMR 06/12/2004, 11/23/2018  ? PFIZER(Purple Top)SARS-COV-2 Vaccination 04/16/2019, 05/10/2019, 12/17/2019  ? PPD Test 06/25/2019, 07/04/2020  ? Td 06/12/2004  ? Tdap 06/04/2017  ? ?2nd concern of mildly depressed mood: ?-at least 3 months or so ?-symptoms: lack of motivation, usually used to get things done and now "doesn't care", feels down on some days, occ cognitive fog ?-denies: anxiety, SI, thoughts of harm to self or other, feeling sad ? ?ROS: See pertinent positives and negatives per HPI. ? ?Past Medical History:  ?Diagnosis Date  ? ALLERGIC RHINITIS 11/03/2006  ? ANXIETY 09/15/2006  ? Cancer [facility])   ? tongue 2018  ? 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 GERD 09/15/2006  ? Hx of adenomatous polyp of rectum 02/13/2016  ?  HYPERLIPIDEMIA 09/13/2006  ? OBSTRUCTIVE SLEEP APNEA 09/13/2006  ? test about 2002-mild-mod-did not use cpap-  ? Seizures ([place])   ? first seizure was 10/26/2015/ pt is followed by Dr. Arlyn Leak at [place], RIGHT 11/03/2006  ? Sleep apnea 2002  ? on c-pap  ? TESTICULAR MASS 11/03/2009  ? 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 tongue-base cancer  ? ? ?Past Surgical History:  ?Procedure Laterality Date  ? ARTHROSCOPIC REPAIR ACL  2008  ? right  ? COLONOSCOPY  2018  ? polyps  ? DEEP NECK LYMPH NODE BIOPSY / EXCISION Left 2018  ? along with tongue base cancel removal  ? INGUINAL HERNIA REPAIR Right 06/26/2012  ? Procedure: HERNIA REPAIR INGUINAL ADULT;  Surgeon: Earnstine Regal, MD;  Location: [place];  Service: General;  Laterality: Right;  ? inguinal herniorrhapy    ? age 3 and age 68 both rt and lt  ? INSERTION OF MESH Right 06/26/2012  ? Procedure: INSERTION OF MESH;  Surgeon: Earnstine Regal, MD;  Location: [place];  Service: General;  Laterality: Right;  ? POLYPECTOMY  2018  ? RENAL BIOPSY Left 1979  ? TONGUE SURGERY  2018  ? left-tongue base cancel  ? TONSILLECTOMY  1969  ? [place] EXTRACTION  2011  ? ? ? ?Current Outpatient Medications:  ?  albuterol (PROAIR HFA) 108 (90 Base) MCG/ACT inhaler, Inhale 2 puffs into the lungs every 6 (six) hours as needed for wheezing or shortness of breath., Disp: 1 each, Rfl: 0 ?  benzonatate (TESSALON PERLES) 100 MG capsule, 1-2 capsules up to twice daily  as needed for cough, Disp: 30 capsule, Rfl: 0 ?  doxycycline (VIBRA-TABS) 100 MG tablet, Take 1 tablet (100 mg total) by mouth 2 (two) times daily., Disp: 14 tablet, Rfl: 0 ?  Famotidine (PEPCID PO), Take by mouth as needed., Disp: , Rfl:  ?  Multiple Vitamin (MULTIVITAMIN) tablet, Take by mouth daily., Disp: , Rfl:  ?  Oxcarbazepine (TRILEPTAL) 300 MG tablet, Take 300 mg by mouth 2 (two) times daily., Disp: , Rfl:  ?  rosuvastatin (CRESTOR) 20 MG tablet, TAKE 1 TABLET BY MOUTH EVERY DAY,  Disp: 90 tablet, Rfl: 3 ?  VITAMIN D PO, Take 4,000 Units by mouth., Disp: , Rfl:  ? ?EXAM: ? ?VITALS per patient if applicable: ? ?GENERAL: alert, oriented, appears well and in no acute distress ? ?HEENT: atraumatic, conjunttiva clear, no obvious abnormalities on inspection of external nose and ears ? ?NECK: normal movements of the head and neck ? ?LUNGS: on inspection no signs of respiratory distress, breathing rate appears normal, no obvious gross SOB, gasping or wheezing ? ?CV: no obvious cyanosis ? ?MS: moves all visible extremities without noticeable abnormality ? ?PSYCH/NEURO: pleasant and cooperative, no obvious depression or anxiety, speech and thought processing grossly intact ? ?ASSESSMENT AND PLAN: ? ?Discussed the following assessment and plan: ?-we discussed possible serious and likely etiologies, options for evaluation and workup, treatment, treatment risks and precautions. Pt is agreeable to treatment via telemedicine at this moment. See summary for each: ? ?Cough, unspecified type ?Wheeze ?-query viral illness, now possible 2ndary bacterial resp illness vs other. He has opted to try alb 2 puffs q 6 hours prn and tessalon for cough with initiation of doxy if any worsening or not improving over the next 1-2 days. Agrees to follow up prn.  ? ?Depressed mood ?-denies any alarm symptoms. Discussed options for treatment and he would like to try CBT. Number for [facility] provided. Return and emergency precautions discussed. Advised PCP follow up to ensure improving or sooner if any worsening. He agrees to schedule.  ? ? ?Work/School slipped offered: declined ?Scheduled follow up with PCP offered: He agrees to schedule follow up with PCP regarding depression after seeking therapist or if worsening or not improving with therapy or in interim before seeing therapist.  ?Advised to seek prompt virtual visit or in person care if worsening, new symptoms arise, or if is not improving with treatment  as expected per our conversation of expected course. Discussed options for follow up care. Did let this patient know that I do telemedicine on Tuesdays and Thursdays for [facility] and those are the days I am logged into the system. Advised to schedule follow up visit with PCP, [facility] virtual visits or [facility] if any further questions or concerns to avoid delays in care. ?  ?I discussed the assessment and treatment plan with the patient. The patient was provided an opportunity to ask questions and all were answered. The patient agreed with the plan and demonstrated an understanding of the instructions. ?  ? ? ?Lucretia Kern, DO  ? ?

## 2021-03-15 NOTE — Patient Instructions (Addendum)
-  I sent the medication(s) we discussed to your pharmacy: ?Meds ordered this encounter  ?Medications  ? benzonatate (TESSALON PERLES) 100 MG capsule  ?  Sig: 1-2 capsules up to twice daily as needed for cough  ?  Dispense:  30 capsule  ?  Refill:  0  ? albuterol (PROAIR HFA) 108 (90 Base) MCG/ACT inhaler  ?  Sig: Inhale 2 puffs into the lungs every 6 (six) hours as needed for wheezing or shortness of breath.  ?  Dispense:  1 each  ?  Refill:  0  ? doxycycline (VIBRA-TABS) 100 MG tablet  ?  Sig: Take 1 tablet (100 mg total) by mouth 2 (two) times daily.  ?  Dispense:  14 tablet  ?  Refill:  0  ? ? ?I hope you are feeling better soon! ? ? ?[]  Schedule counseling(cognitive behavioral therapy). ?  [facility] is a good option. ?  Call for appointment: [phone] ? ? ?Seek in person care promptly if your symptoms worsen, new concerns arise or you are not improving with treatment. Call 911 and seek emergency care if any severe or life threatening symptoms or thoughts of harm to yourself or other.  ? ?It was nice to meet you today. I help [facility] out with telemedicine visits on Tuesdays and Thursdays and am happy to help if you need a virtual follow up visit on those days. Otherwise, if you have any concerns or questions following this visit please schedule a follow up visit with your Primary Care office or seek care at a local urgent care clinic to avoid delays in care ? ?

## 2021-04-06 DIAGNOSIS — G4761 Periodic limb movement disorder: Secondary | ICD-10-CM | POA: Diagnosis not present

## 2021-04-06 DIAGNOSIS — G4733 Obstructive sleep apnea (adult) (pediatric): Secondary | ICD-10-CM | POA: Diagnosis not present

## 2021-07-20 DIAGNOSIS — G40109 Localization-related (focal) (partial) symptomatic epilepsy and epileptic syndromes with simple partial seizures, not intractable, without status epilepticus: Secondary | ICD-10-CM | POA: Diagnosis not present

## 2021-07-20 DIAGNOSIS — G4733 Obstructive sleep apnea (adult) (pediatric): Secondary | ICD-10-CM | POA: Diagnosis not present

## 2021-08-29 DIAGNOSIS — H40013 Open angle with borderline findings, low risk, bilateral: Secondary | ICD-10-CM | POA: Diagnosis not present

## 2022-01-22 ENCOUNTER — Telehealth: Payer: Self-pay | Admitting: *Deleted

## 2022-01-22 ENCOUNTER — Encounter: Payer: Self-pay | Admitting: *Deleted

## 2022-01-22 NOTE — Patient Instructions (Signed)
Visit Information  Thank you for taking time to visit with me today. Please don't hesitate to contact me if I can be of assistance to you.   Following are the goals we discussed today:   Goals Addressed             This Visit's Progress    COMPLETED: care coordination activity       Care Coordination Interventions: Reviewed medications with patient and discussed adherence with no needed refills Reviewed scheduled/upcoming provider appointments including sufficient transportation source Screening for signs and symptoms of depression related to chronic disease state  Assessed social determinant of health barriers Educated on care management services with no needs presented at this time.         Please call the care guide team at [phone] if you need to cancel or reschedule your appointment.   If you are experiencing a Mental Health or [place] or need someone to talk to, please call the Suicide and Crisis Lifeline: 988  Patient verbalizes understanding of instructions and care plan provided today and agrees to view in [place]. Active MyChart status and patient understanding of how to access instructions and care plan via MyChart confirmed with patient.     No further follow up required: No follow up needs  Raina Mina, RN Care Management Coordinator [place] Office [phone]

## 2022-01-22 NOTE — Patient Outreach (Signed)
  Care Coordination   Initial Visit Note   01/22/2022 Name: Baden Betsch MRN: [id number] DOB: October 16, 1955  Niquan Charnley is a 67 y.o. year old male who sees Biagio Borg, MD for primary care. I spoke with  Blenda Peals by phone today.  What matters to the patients health and wellness today?  No needs    Goals Addressed             This Visit's Progress    COMPLETED: care coordination activity       Care Coordination Interventions: Reviewed medications with patient and discussed adherence with no needed refills Reviewed scheduled/upcoming provider appointments including sufficient transportation source Screening for signs and symptoms of depression related to chronic disease state  Assessed social determinant of health barriers Educated on care management services with no needs presented at this time.          SDOH assessments and interventions completed:  Yes  SDOH Interventions Today    Flowsheet Row Most Recent Value  SDOH Interventions   Food Insecurity Interventions Intervention Not Indicated  Housing Interventions Intervention Not Indicated  Transportation Interventions Intervention Not Indicated  Utilities Interventions Intervention Not Indicated        Care Coordination Interventions:  Yes, provided   Follow up plan: No further intervention required.   Encounter Outcome:  Pt. Visit Completed   Raina Mina, RN Care Management Coordinator [place] Office [phone]

## 2022-02-11 ENCOUNTER — Other Ambulatory Visit: Payer: Self-pay | Admitting: Internal Medicine

## 2022-02-11 NOTE — Telephone Encounter (Signed)
Please refill as per office routine med refill policy (all routine meds to be refilled for 3 mo or monthly (per pt preference) up to one year from last visit, then month to month grace period for 3 mo, then further med refills will have to be denied)

## 2022-03-05 ENCOUNTER — Other Ambulatory Visit: Payer: Self-pay | Admitting: Internal Medicine

## 2022-03-05 NOTE — Telephone Encounter (Signed)
Please refill as per office routine med refill policy (all routine meds to be refilled for 3 mo or monthly (per pt preference) up to one year from last visit, then month to month grace period for 3 mo, then further med refills will have to be denied) ? ?

## 2022-09-09 ENCOUNTER — Ambulatory Visit (INDEPENDENT_AMBULATORY_CARE_PROVIDER_SITE_OTHER): Payer: Self-pay | Admitting: Internal Medicine

## 2022-09-09 ENCOUNTER — Encounter: Payer: Self-pay | Admitting: Internal Medicine

## 2022-09-09 VITALS — BP 130/88 | HR 73 | Temp 98.9°F | Ht 73.0 in | Wt 206.0 lb

## 2022-09-09 DIAGNOSIS — R351 Nocturia: Secondary | ICD-10-CM

## 2022-09-09 DIAGNOSIS — N401 Enlarged prostate with lower urinary tract symptoms: Secondary | ICD-10-CM

## 2022-09-09 DIAGNOSIS — E559 Vitamin D deficiency, unspecified: Secondary | ICD-10-CM

## 2022-09-09 DIAGNOSIS — E538 Deficiency of other specified B group vitamins: Secondary | ICD-10-CM

## 2022-09-09 DIAGNOSIS — R972 Elevated prostate specific antigen [PSA]: Secondary | ICD-10-CM

## 2022-09-09 MED ORDER — TAMSULOSIN HCL 0.4 MG PO CAPS: 0.4000 mg | ORAL_CAPSULE | Freq: Every day | ORAL | 3 refills | Status: DC

## 2022-09-09 MED ORDER — ROSUVASTATIN CALCIUM 20 MG PO TABS: ORAL_TABLET | ORAL | 3 refills | Status: DC

## 2022-09-09 NOTE — Progress Notes (Signed)
Patient ID: Bradley Roberts, male   DOB: 05-02-1955, 67 y.o.   MRN: [id number]         Chief Complaint:: yearly exam       HPI:  Bradley Roberts is a 67 y.o. male here to f/u, Pt denies chest pain, increased sob or doe, wheezing, orthopnea, PND, increased LE swelling, palpitations, dizziness or syncope.   Pt denies polydipsia, polyuria, or new focal neuro s/s.   Also using oral appliance ofr osa.  Now semi retired from [employer], now working with friend at [employer]. 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   Now cured after chemo plus clinical drug. and surgury.     Denies urinary symptoms such as dysuria, frequency, urgency, flank pain, hematuria or n/v, fever, chills, but has less forceful stream recently, no longer standing. Has been putting off med so far; s/p biopsy for elevated PSA- negatvie at [facility] urology   Wt Readings from Last 3 Encounters:  09/09/22 206 lb (93.4 kg)  02/05/21 205 lb (93 kg)  09/17/19 208 lb (94.3 kg)   BP Readings from Last 3 Encounters:  09/09/22 130/88  02/05/21 114/76  09/17/19 111/78   Immunization History  Administered Date(s) Administered   Influenza Inj Mdck Quad Pf 10/01/2017   Influenza,inj,Quad PF,6+ Mos 12/17/2019   Influenza-Unspecified 10/06/2014, 09/29/2015, 10/23/2020   MMR 06/12/2004, 11/23/2018   PFIZER(Purple Top)SARS-COV-2 Vaccination 04/16/2019, 05/10/2019, 12/17/2019   PPD Test 06/25/2019, 07/04/2020   Td 06/12/2004   Tdap 06/04/2017   Health Maintenance Due  Topic Date Due   Zoster Vaccines- Shingrix (1 of 2) Never done   Pneumonia Vaccine 66+ Years old (1 of 1 - PCV) Never done   INFLUENZA VACCINE  08/15/2022      Past Medical History:  Diagnosis Date   ALLERGIC RHINITIS 11/03/2006   ANXIETY 09/15/2006   Cancer (HCC)    tongue 2018   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 Baldo Daub at [facility], RIGHT 11/03/2006   Sleep apnea 2002   on c-pap   TESTICULAR MASS 11/03/2009   Tongue cancer (HCC) 2018   tongue-base cancer   Past Surgical History:  Procedure Laterality Date   ARTHROSCOPIC REPAIR ACL  2008   right   COLONOSCOPY  2018   polyps   DEEP NECK LYMPH NODE BIOPSY / EXCISION Left 2018   along with tongue base cancel removal   INGUINAL HERNIA REPAIR Right 06/26/2012   Procedure: HERNIA REPAIR INGUINAL ADULT;  Surgeon: Velora Heckler, MD;  Location: [facility];  Service: General;  Laterality: Right;   inguinal herniorrhapy     age 17 and age 50 both rt and lt   INSERTION OF MESH Right 06/26/2012   Procedure: INSERTION OF MESH;  Surgeon: Velora Heckler, MD;  Location: [facility];  Service: General;  Laterality: Right;   POLYPECTOMY  2018   RENAL BIOPSY Left 1979   TONGUE SURGERY  2018   left-tongue base cancel   TONSILLECTOMY  1969   WISDOM TOOTH EXTRACTION  2011    reports that he has never smoked. He has never used smokeless  tobacco. He reports current alcohol use of about 3.0 - 4.0 standard drinks of alcohol per week. He reports that he does not use drugs. family history includes Brain cancer (age of onset: 19) in his mother; Diabetes in his paternal grandmother; Heart attack in his maternal grandfather and another family member; Heart attack (age of onset: 49) in his father; Liver cancer (age of onset: 52) in his mother; Lung cancer (age of onset: 33) in his mother. No Known Allergies Current Outpatient Medications on File Prior to Visit  Medication Sig Dispense Refill   Oxcarbazepine (TRILEPTAL) 300 MG tablet Take 300 mg by mouth 2 (two) times daily.     VITAMIN D PO Take 4,000 Units by mouth. (Patient not taking: Reported on  09/09/2022)     No current facility-administered medications on file prior to visit.        ROS:  All others reviewed and negative.  Objective        PE:  BP 130/88 (BP Location: Left Arm, Patient Position: Sitting, Cuff Size: Normal)   Pulse 73   Temp 98.9 F (37.2 C) (Oral)   Ht 6\' 1"  (1.854 m)   Wt 206 lb (93.4 kg)   SpO2 97%   BMI 27.18 kg/m                 Constitutional: Pt appears in NAD               HENT: Head: NCAT.                Right Ear: External ear normal.                 Left Ear: External ear normal.                Eyes: . Pupils are equal, round, and reactive to light. Conjunctivae and EOM are normal               Nose: without d/c or deformity               Neck: Neck supple. Gross normal ROM               Cardiovascular: Normal rate and regular rhythm.                 Pulmonary/Chest: Effort normal and breath sounds without rales or wheezing.                Abd:  Soft, NT, ND, + BS, no organomegaly               Neurological: Pt is alert. At baseline orientation, motor grossly intact               Skin: Skin is warm. No rashes, no other new lesions, LE edema - none               Psychiatric: Pt behavior is normal without agitation   Micro: none  Cardiac tracings I have personally interpreted today:  none  Pertinent Radiological findings (summarize): none   Lab Results  Component Value Date   WBC 8.3 06/23/2019   HGB 14.8 06/23/2019   HCT 43.1 06/23/2019   PLT 184.0 06/23/2019   GLUCOSE 91 06/23/2019   CHOL 157 06/23/2019   TRIG 172.0 (H) 06/23/2019   HDL 31.80 (L) 06/23/2019   LDLDIRECT 150.0 11/07/2014   LDLCALC 91 06/23/2019   ALT 30 06/23/2019   AST 16 06/23/2019   NA 138  06/23/2019   K 4.2 06/23/2019   CL 104 06/23/2019   CREATININE 1.09 06/23/2019   BUN 18 06/23/2019   CO2 28 06/23/2019   TSH 2.52 06/23/2019   PSA 4.46 (H) 06/23/2019   Assessment/Plan:  Bradley Roberts is a 68 y.o. White or Caucasian [1] male with  has a past medical  history of ALLERGIC RHINITIS (11/03/2006), ANXIETY (09/15/2006), Cancer (HCC), EPIDIDYMO-ORCHITIS (11/03/2009), GERD (09/15/2006), Hx of adenomatous polyp of rectum (02/13/2016), HYPERLIPIDEMIA (09/13/2006), OBSTRUCTIVE SLEEP APNEA (09/13/2006), Seizures (HCC), SHOULDER PAIN, RIGHT (11/03/2006), Sleep apnea (2002), TESTICULAR MASS (11/03/2009), and Tongue cancer (HCC) (2018).  BPH associated with nocturia With increased symptoms, for flomax 0.4 qd  Vitamin D deficiency Last vitamin D Lab Results  Component Value Date   VD25OH 17.31 (L) 06/23/2019   Low, to start oral replacement   Elevated PSA Also for f/u psa  B12 deficiency Lab Results  Component Value Date   VITAMINB12 226 06/23/2019   Low, to start oral replacement - b12 1000 mcg qd  Followup: Return in about 1 year (around 09/09/2023).  Oliver Barre, MD 09/09/2022 1:01 PM [facility]

## 2022-09-09 NOTE — Assessment & Plan Note (Signed)
With increased symptoms, for flomax 0.4 qd

## 2022-09-09 NOTE — Patient Instructions (Signed)
Please take all new medication as prescribed - the flomax  - once per day  Please continue all other medications as before, and refills have been done if requested - the crestor  Please have the pharmacy call with any other refills you may need.  Please continue your efforts at being more active, low cholesterol diet, and weight control.  Please keep your appointments with your specialists as you may have planned  Please consider having the Prevnar 20, flu shot, and Shingrx at the pharmacy  Please go to the LAB at the blood drawing area for the tests to be done - at your convenience  You will be contacted by phone if any changes need to be made immediately.  Otherwise, you will receive a letter about your results with an explanation, but please check with MyChart first.  Please make an Appointment to return for your 1 year visit, or sooner if needed

## 2022-09-09 NOTE — Assessment & Plan Note (Signed)
Last vitamin D Lab Results  Component Value Date   VD25OH 17.31 (L) 06/23/2019   Low, to start oral replacement

## 2022-09-09 NOTE — Assessment & Plan Note (Signed)
Lab Results  Component Value Date   VITAMINB12 226 06/23/2019   Low, to start oral replacement - b12 1000 mcg qd

## 2022-09-09 NOTE — Assessment & Plan Note (Signed)
Also for f/u psa 

## 2023-07-11 DIAGNOSIS — G4733 Obstructive sleep apnea (adult) (pediatric): Secondary | ICD-10-CM | POA: Diagnosis not present

## 2023-07-14 ENCOUNTER — Encounter: Payer: Self-pay | Admitting: Internal Medicine

## 2023-07-14 ENCOUNTER — Ambulatory Visit (INDEPENDENT_AMBULATORY_CARE_PROVIDER_SITE_OTHER): Admitting: Internal Medicine

## 2023-07-14 ENCOUNTER — Other Ambulatory Visit: Payer: Self-pay | Admitting: Internal Medicine

## 2023-07-14 ENCOUNTER — Ambulatory Visit: Payer: Self-pay | Admitting: Internal Medicine

## 2023-07-14 VITALS — BP 124/78 | HR 88 | Temp 99.3°F | Ht 73.0 in | Wt 205.0 lb

## 2023-07-14 DIAGNOSIS — J4531 Mild persistent asthma with (acute) exacerbation: Secondary | ICD-10-CM | POA: Diagnosis not present

## 2023-07-14 DIAGNOSIS — E559 Vitamin D deficiency, unspecified: Secondary | ICD-10-CM | POA: Diagnosis not present

## 2023-07-14 DIAGNOSIS — R3129 Other microscopic hematuria: Secondary | ICD-10-CM

## 2023-07-14 DIAGNOSIS — K429 Umbilical hernia without obstruction or gangrene: Secondary | ICD-10-CM

## 2023-07-14 DIAGNOSIS — Z0001 Encounter for general adult medical examination with abnormal findings: Secondary | ICD-10-CM

## 2023-07-14 DIAGNOSIS — E7800 Pure hypercholesterolemia, unspecified: Secondary | ICD-10-CM | POA: Diagnosis not present

## 2023-07-14 DIAGNOSIS — R972 Elevated prostate specific antigen [PSA]: Secondary | ICD-10-CM | POA: Diagnosis not present

## 2023-07-14 DIAGNOSIS — R739 Hyperglycemia, unspecified: Secondary | ICD-10-CM

## 2023-07-14 DIAGNOSIS — E538 Deficiency of other specified B group vitamins: Secondary | ICD-10-CM | POA: Diagnosis not present

## 2023-07-14 DIAGNOSIS — J45909 Unspecified asthma, uncomplicated: Secondary | ICD-10-CM | POA: Insufficient documentation

## 2023-07-14 LAB — LIPID PANEL
Cholesterol: 160 mg/dL (ref 0–200)
HDL: 29.7 mg/dL — ABNORMAL LOW (ref >39.00)
LDL Cholesterol: 97 mg/dL (ref 0–99)
NonHDL: 130.1
Total CHOL/HDL Ratio: 5
Triglycerides: 167 mg/dL — ABNORMAL HIGH (ref 0.0–149.0)
VLDL: 33.4 mg/dL (ref 0.0–40.0)

## 2023-07-14 LAB — CBC WITH DIFFERENTIAL/PLATELET
Basophils Absolute: 0.1 10*3/uL (ref 0.0–0.1)
Basophils Relative: 0.6 % (ref 0.0–3.0)
Eosinophils Absolute: 0.2 10*3/uL (ref 0.0–0.7)
Eosinophils Relative: 1.5 % (ref 0.0–5.0)
HCT: 44.9 % (ref 39.0–52.0)
Hemoglobin: 15.6 g/dL (ref 13.0–17.0)
Lymphocytes Relative: 14.1 % (ref 12.0–46.0)
Lymphs Abs: 1.5 10*3/uL (ref 0.7–4.0)
MCHC: 34.7 g/dL (ref 30.0–36.0)
MCV: 91.6 fl (ref 78.0–100.0)
Monocytes Absolute: 1.7 10*3/uL — ABNORMAL HIGH (ref 0.1–1.0)
Monocytes Relative: 15.9 % — ABNORMAL HIGH (ref 3.0–12.0)
Neutro Abs: 7.4 10*3/uL (ref 1.4–7.7)
Neutrophils Relative %: 67.9 % (ref 43.0–77.0)
Platelets: 165 10*3/uL (ref 150.0–400.0)
RBC: 4.9 Mil/uL (ref 4.22–5.81)
RDW: 13.3 % (ref 11.5–15.5)
WBC: 10.9 10*3/uL — ABNORMAL HIGH (ref 4.0–10.5)

## 2023-07-14 LAB — BASIC METABOLIC PANEL WITH GFR
BUN: 17 mg/dL (ref 6–23)
CO2: 26 meq/L (ref 19–32)
Calcium: 9.2 mg/dL (ref 8.4–10.5)
Chloride: 101 meq/L (ref 96–112)
Creatinine, Ser: 1.27 mg/dL (ref 0.40–1.50)
GFR: 58.88 mL/min — ABNORMAL LOW (ref >60.00)
Glucose, Bld: 96 mg/dL (ref 70–99)
Potassium: 4.1 meq/L (ref 3.5–5.1)
Sodium: 139 meq/L (ref 135–145)

## 2023-07-14 LAB — URINALYSIS, ROUTINE W REFLEX MICROSCOPIC
Bilirubin Urine: NEGATIVE
Ketones, ur: NEGATIVE
Leukocytes,Ua: NEGATIVE
Nitrite: NEGATIVE
Specific Gravity, Urine: >=1.03 — AB (ref 1.000–1.030)
Total Protein, Urine: 30 — AB
Urine Glucose: NEGATIVE
Urobilinogen, UA: 0.2 (ref 0.0–1.0)
pH: 5.5 (ref 5.0–8.0)

## 2023-07-14 LAB — HEPATIC FUNCTION PANEL
ALT: 23 U/L (ref 0–53)
AST: 15 U/L (ref 0–37)
Albumin: 4.7 g/dL (ref 3.5–5.2)
Alkaline Phosphatase: 87 U/L (ref 39–117)
Bilirubin, Direct: 0.2 mg/dL (ref 0.0–0.3)
Total Bilirubin: 1 mg/dL (ref 0.2–1.2)
Total Protein: 7.2 g/dL (ref 6.0–8.3)

## 2023-07-14 LAB — VITAMIN B12: Vitamin B-12: 309 pg/mL (ref 211–911)

## 2023-07-14 LAB — TSH: TSH: 3.59 u[IU]/mL (ref 0.35–5.50)

## 2023-07-14 LAB — VITAMIN D 25 HYDROXY (VIT D DEFICIENCY, FRACTURES): VITD: 28.64 ng/mL — ABNORMAL LOW (ref 30.00–100.00)

## 2023-07-14 LAB — HEMOGLOBIN A1C: Hgb A1c MFr Bld: 5.9 % (ref 4.6–6.5)

## 2023-07-14 LAB — PSA: PSA: 8.76 ng/mL — ABNORMAL HIGH (ref 0.10–4.00)

## 2023-07-14 MED ORDER — AZITHROMYCIN 250 MG PO TABS: ORAL_TABLET | ORAL | 1 refills | Status: AC

## 2023-07-14 MED ORDER — TAMSULOSIN HCL 0.4 MG PO CAPS: 0.4000 mg | ORAL_CAPSULE | Freq: Every day | ORAL | 3 refills | Status: AC

## 2023-07-14 MED ORDER — ROSUVASTATIN CALCIUM 20 MG PO TABS: ORAL_TABLET | ORAL | 3 refills | Status: AC

## 2023-07-14 MED ORDER — PREDNISONE 10 MG PO TABS: ORAL_TABLET | ORAL | 0 refills | Status: DC

## 2023-07-14 NOTE — Assessment & Plan Note (Signed)
Also for urology referral

## 2023-07-14 NOTE — Patient Instructions (Addendum)
 Please have your Shingrix (shingles) shots done at your local pharmacy., and the Prevnar 20 pneumonia   Please take all new medication as prescribed - the antibiotic, and prednisone, and cough medicine as needed  Please continue all other medications as before, and refills have been done if requested.  Please have the pharmacy call with any other refills you may need.  Please continue your efforts at being more active, low cholesterol diet, and weight control.  You are otherwise up to date with prevention measures today.  Please keep your appointments with your specialists as you may have planned  You will be contacted regarding the referral for: General Surgury  Please go to the LAB at the blood drawing area for the tests to be done  You will be contacted by phone if any changes need to be made immediately.  Otherwise, you will receive a letter about your results with an explanation, but please check with MyChart first.  Please make an Appointment to return for your 1 year visit, or sooner if needed

## 2023-07-14 NOTE — Assessment & Plan Note (Signed)
 Last vitamin D  Lab Results  Component Value Date   VD25OH 28.64 (L) 07/14/2023   Low, to start oral replacement

## 2023-07-14 NOTE — Assessment & Plan Note (Signed)
 Age and sex appropriate education and counseling updated with regular exercise and diet Referrals for preventative services - none needed Immunizations addressed - for shingrix and prevnar 20 at pharmacy Smoking counseling  - none needed Evidence for depression or other mood disorder - none significant Most recent labs reviewed. I have personally reviewed and have noted: 1) the patient's medical and social history 2) The patient's current medications and supplements 3) The patient's height, weight, and BMI have been recorded in the chart

## 2023-07-14 NOTE — Assessment & Plan Note (Signed)
Mild to mod, for antibx course zpack, cough med prn, prednisone taper,  to f/u any worsening symptoms or concerns

## 2023-07-14 NOTE — Assessment & Plan Note (Signed)
 Lab Results  Component Value Date   VITAMINB12 309 07/14/2023   Stable, cont oral replacement - b12 1000 mcg qd

## 2023-07-14 NOTE — Assessment & Plan Note (Signed)
 Lab Results  Component Value Date   LDLCALC 97 07/14/2023   Stable, pt to continue current statin crestor  20 mg qd

## 2023-07-14 NOTE — Assessment & Plan Note (Signed)
 With mild worsening overall, - for referral general surgury

## 2023-07-14 NOTE — Progress Notes (Signed)
 Patient ID: Bradley Roberts, male   DOB: [birth year], 68 y.o.   MRN: [id number]         Chief Complaint:: wellness exam and Medical Management of Chronic Issues (Scratchy throat, having trouble sleeping and lots of mucus has been going on for 4 days , thinks he may have a hernia on his stomach as well )  Associated with cough and wheeziness, also worsening umbilical hernia       HPI:  Bradley Roberts is a 68 y.o. male here for wellness exam; for shingrix and prevnar 20 at pharmacy, o/w up to date                        Also now semi retired, working part time. Here with acute onset mild to mod 2-3 days ST, HA, general weakness and malaise, with prod cough greenish sputum, but Pt denies chest pain, orthopnea, PND, increased LE swelling, palpitations, dizziness or syncope, but has had mild sob doe wheezing as well.   Pt denies polydipsia, polyuria, or new focal neuro s/s.    Pt denies fever, wt loss, night sweats, loss of appetite, or other constitutional symptoms  Also has worsening umbilical hernia size and new mild discomfort, asking for referral.  Wt Readings from Last 3 Encounters:  07/14/23 205 lb (93 kg)  09/09/22 206 lb (93.4 kg)  02/05/21 205 lb (93 kg)   BP Readings from Last 3 Encounters:  07/14/23 124/78  09/09/22 130/88  02/05/21 114/76   Immunization History  Administered Date(s) Administered   Influenza Inj Mdck Quad Pf 10/01/2017   Influenza,inj,Quad PF,6+ Mos 12/17/2019   Influenza-Unspecified 10/06/2014, 09/29/2015, 10/01/2017, 10/23/2020   MMR 06/12/2004, 11/23/2018   PFIZER(Purple Top)SARS-COV-2 Vaccination 04/16/2019, 05/10/2019, 12/17/2019   PPD Test 06/25/2019, 07/04/2020   Td 06/12/2004   Tdap 06/04/2017   Health Maintenance Due  Topic Date Due   Medicare Annual Wellness (AWV)  Never done   Zoster Vaccines- Shingrix (1 of 2) Never done   Pneumococcal Vaccine: 50+ Years (1 of 1 - PCV) Never done      Past Medical History:  Diagnosis Date   ALLERGIC RHINITIS  11/03/2006   ANXIETY 09/15/2006   Cancer (HCC)    tongue 2018   EPIDIDYMO-ORCHITIS 11/03/2009   GERD 09/15/2006   Hx of adenomatous polyp of rectum 02/13/2016   HYPERLIPIDEMIA 09/13/2006   OBSTRUCTIVE SLEEP APNEA 09/13/2006   test about 2002-mild-mod-did not use cpap-   Seizures (HCC)    first seizure was 10/26/2015/ pt is followed by Dr. 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   Past Surgical History:  Procedure Laterality Date   ARTHROSCOPIC REPAIR ACL  2008   right   COLONOSCOPY  2018   polyps   DEEP NECK LYMPH NODE BIOPSY / EXCISION Left 2018   along with tongue base cancel removal   INGUINAL HERNIA REPAIR Right 06/26/2012   Procedure: HERNIA REPAIR INGUINAL ADULT;  Surgeon: Krystal CHRISTELLA Spinner, MD;  Location: [facility];  Service: General;  Laterality: Right;   inguinal herniorrhapy     age 85 and age 85 both rt and lt   INSERTION OF MESH Right 06/26/2012   Procedure: INSERTION OF MESH;  Surgeon: Krystal CHRISTELLA Spinner, MD;  Location: [facility];  Service: General;  Laterality: Right;   POLYPECTOMY  2018   RENAL BIOPSY Left 1979   TONGUE SURGERY  2018   left-tongue base cancel   TONSILLECTOMY  1969   WISDOM TOOTH EXTRACTION  2011    reports that he has never smoked. He has never used smokeless tobacco. He reports current alcohol use of about 3.0 - 4.0 standard drinks of alcohol per week. He reports that he does not use drugs. family history includes Brain cancer (age of onset: 60) in his mother; Diabetes in his paternal grandmother; Heart attack in his maternal grandfather and another family member; Heart attack (age of onset: 31) in his father; Liver cancer (age of onset: 38) in his mother; Lung cancer (age of onset: 68) in his mother. No Known Allergies Current Outpatient Medications on File Prior to Visit  Medication Sig Dispense Refill    famotidine (PEPCID) 20 MG tablet Take 20 mg by mouth.     Oxcarbazepine  (TRILEPTAL ) 300 MG tablet Take 300 mg by mouth 2 (two) times daily.     VITAMIN D  PO Take 4,000 Units by mouth. (Patient not taking: Reported on 07/14/2023)     No current facility-administered medications on file prior to visit.        ROS:  All others reviewed and negative.  Objective        PE:  BP 124/78 (BP Location: Right Arm, Patient Position: Sitting, Cuff Size: Normal)   Pulse 88   Temp 99.3 F (37.4 C) (Oral)   Ht 6' 1 (1.854 m)   Wt 205 lb (93 kg)   SpO2 98%   BMI 27.05 kg/m                 Constitutional: Pt appears in NAD               HENT: Head: NCAT.                Right Ear: External ear normal.                 Left Ear: External ear normal.                Eyes: . Pupils are equal, round, and reactive to light. Conjunctivae and EOM are normal               Nose: without d/c or deformity               Neck: Neck supple. Gross normal ROM               Cardiovascular: Normal rate and regular rhythm.                 Pulmonary/Chest: Effort normal and breath sounds without rales or wheezing.                Abd:  Soft, NT, ND, + BS, no organomegaly, has moderate enlarged umbilical hernia reducible with minor discomfort               Neurological: Pt is alert. At baseline orientation, motor grossly intact               Skin: Skin is warm. No rashes, no other new lesions, LE edema - none               Psychiatric: Pt behavior is normal without agitation   Micro: none  Cardiac tracings I have personally interpreted today:  none  Pertinent Radiological findings (summarize): none   Lab Results  Component Value  Date   WBC 10.9 (H) 07/14/2023   HGB 15.6 07/14/2023   HCT 44.9 07/14/2023   PLT 165.0 07/14/2023   GLUCOSE 96 07/14/2023   CHOL 160 07/14/2023   TRIG 167.0 (H) 07/14/2023   HDL 29.70 (L) 07/14/2023   LDLDIRECT 150.0 11/07/2014   LDLCALC 97 07/14/2023   ALT 23 07/14/2023   AST  15 07/14/2023   NA 139 07/14/2023   K 4.1 07/14/2023   CL 101 07/14/2023   CREATININE 1.27 07/14/2023   BUN 17 07/14/2023   CO2 26 07/14/2023   TSH 3.59 07/14/2023   PSA 8.76 (H) 07/14/2023   HGBA1C 5.9 07/14/2023   Assessment/Plan:  Olga Seyler is a 68 y.o. White or Caucasian [1] male with  has a past medical history of ALLERGIC RHINITIS (11/03/2006), ANXIETY (09/15/2006), Cancer (HCC), EPIDIDYMO-ORCHITIS (11/03/2009), GERD (09/15/2006), Hx of adenomatous polyp of rectum (02/13/2016), HYPERLIPIDEMIA (09/13/2006), OBSTRUCTIVE SLEEP APNEA (09/13/2006), Seizures (HCC), SHOULDER PAIN, RIGHT (11/03/2006), Sleep apnea (2002), TESTICULAR MASS (11/03/2009), and Tongue cancer (HCC) (2018).  Encounter for well adult exam with abnormal findings Age and sex appropriate education and counseling updated with regular exercise and diet Referrals for preventative services - none needed Immunizations addressed - for shingrix and prevnar 20 at pharmacy Smoking counseling  - none needed Evidence for depression or other mood disorder - none significant Most recent labs reviewed. I have personally reviewed and have noted: 1) the patient's medical and social history 2) The patient's current medications and supplements 3) The patient's height, weight, and BMI have been recorded in the chart   Vitamin D  deficiency Last vitamin D  Lab Results  Component Value Date   VD25OH 28.64 (L) 07/14/2023   Low, to start oral replacement   Umbilical hernia With mild worsening overall, - for referral general surgury  HLD (hyperlipidemia) Lab Results  Component Value Date   LDLCALC 97 07/14/2023   Stable, pt to continue current statin crestor  20 mg qd   Elevated PSA Lab Results  Component Value Date   PSA 8.76 (H) 07/14/2023   PSA 4.46 (H) 06/23/2019   PSA 5.60 (H) 06/22/2018   Worsening, also with microhematuria on ua - for urology referral back to Dr Carolee I believe last seen 2019  B12 deficiency Lab  Results  Component Value Date   VITAMINB12 309 07/14/2023   Stable, cont oral replacement - b12 1000 mcg qd   Asthmatic bronchitis Mild to mod, for antibx course zpack, cough med prn, prednisone taper,  to f/u any worsening symptoms or concerns  Followup: Return in about 1 year (around 07/13/2024).  Lynwood Rush, MD 07/14/2023 7:50 PM [facility]

## 2023-07-14 NOTE — Assessment & Plan Note (Signed)
 Lab Results  Component Value Date   PSA 8.76 (H) 07/14/2023   PSA 4.46 (H) 06/23/2019   PSA 5.60 (H) 06/22/2018   Worsening, also with microhematuria on ua - for urology referral back to Dr Carolee I believe last seen 2019

## 2023-07-15 ENCOUNTER — Telehealth: Payer: Self-pay | Admitting: Internal Medicine

## 2023-07-15 NOTE — Telephone Encounter (Signed)
 Pt has lab appt 8/18 and CPE appt 8/27. Please enter lab orders for this visit.

## 2023-07-15 NOTE — Telephone Encounter (Signed)
 Sorry, no need for more labs as these were done just yesterday   thanks

## 2023-08-19 ENCOUNTER — Other Ambulatory Visit: Payer: Self-pay | Admitting: Internal Medicine

## 2023-08-19 ENCOUNTER — Ambulatory Visit: Payer: Self-pay

## 2023-08-19 DIAGNOSIS — H1032 Unspecified acute conjunctivitis, left eye: Secondary | ICD-10-CM | POA: Diagnosis not present

## 2023-08-19 MED ORDER — DOXYCYCLINE HYCLATE 100 MG PO TABS: 100.0000 mg | ORAL_TABLET | Freq: Two times a day (BID) | ORAL | 0 refills | Status: DC

## 2023-08-19 NOTE — Telephone Encounter (Signed)
 FYI Only or Action Required?: Action required by provider: medication refill request.  Patient was last seen in primary care on 07/14/2023 by Norleen Lynwood ORN, MD.  Called Nurse Triage reporting Cough.  Symptoms began several days ago.  Interventions attempted: OTC medications: delsym and dayquil.  Symptoms are: gradually worsening.  Triage Disposition: See Physician Within 24 Hours  Patient/caregiver understands and will follow disposition?: No, wishes to speak with PCP           Copied from CRM #[id number]. Topic: Clinical - Red Word Triage >> Aug 19, 2023  9:20 AM Robinson H wrote: [facility] that prompted transfer to Nurse Triage: Had antibiotics 4 weeks ago for similar thing now it's back, scratchy throat, grey thick mucus and hard to breathe at night, coughing at night to the point it's effecting his breathing. Increasing in the last 3 days, fatigue. Reason for Disposition  SEVERE coughing spells (e.g., whooping sound after coughing, vomiting after coughing)  Answer Assessment - Initial Assessment Questions 1. ONSET: When did the cough begin?      Friday 2. SEVERITY: How bad is the cough today?      Mod-severe 3. SPUTUM: Describe the color of your sputum (e.g., none, dry cough; clear, white, yellow, green)     Thick grey  4. HEMOPTYSIS: Are you coughing up any blood? If Yes, ask: How much? (e.g., flecks, streaks, tablespoons, etc.)     no 5. DIFFICULTY BREATHING: Are you having difficulty breathing? If Yes, ask: How bad is it? (e.g., mild, moderate, severe)      Yes, at night due to phlegm 6. FEVER: Do you have a fever? If Yes, ask: What is your temperature, how was it measured, and when did it start?     no 7. CARDIAC HISTORY: Do you have any history of heart disease? (e.g., heart attack, congestive heart failure)      no 8. LUNG HISTORY: Do you have any history of lung disease?  (e.g., pulmonary embolus, asthma, emphysema)     no 9. PE RISK FACTORS:  Do you have a history of blood clots? (or: recent major surgery, recent prolonged travel, bedridden)     no 10. OTHER SYMPTOMS: Do you have any other symptoms? (e.g., runny nose, wheezing, chest pain)       Sore throat  Protocols used: Cough - Acute Productive-A-AH

## 2023-08-19 NOTE — Telephone Encounter (Signed)
 Pt states his symptoms are the same as he saw Dr. Norleen for 4 weeks ago and he is currently at work and can't get off and is wanting to have medication called in. States that the last round of antibiotics worked about 85%.   Pt states can text or leave message on his phone.   Please advise.

## 2023-08-19 NOTE — Telephone Encounter (Signed)
 Called and informed patient of new prescription being placed. They expressed understanding and stated they would pick this up by end of day

## 2023-08-19 NOTE — Telephone Encounter (Addendum)
 Ok I will send doxyycline course - done erx

## 2023-09-01 ENCOUNTER — Other Ambulatory Visit: Payer: Self-pay

## 2023-09-10 ENCOUNTER — Ambulatory Visit: Payer: Self-pay | Admitting: Internal Medicine

## 2023-09-10 VITALS — Ht 73.0 in

## 2023-09-11 ENCOUNTER — Other Ambulatory Visit: Payer: Self-pay | Admitting: Surgery

## 2023-09-11 DIAGNOSIS — K429 Umbilical hernia without obstruction or gangrene: Secondary | ICD-10-CM | POA: Diagnosis not present

## 2023-09-14 ENCOUNTER — Encounter: Payer: Self-pay | Admitting: Internal Medicine

## 2023-09-14 NOTE — Progress Notes (Signed)
 Pt not seen.

## 2023-09-14 NOTE — Patient Instructions (Signed)
 Pt not seen.

## 2023-10-09 DIAGNOSIS — R3121 Asymptomatic microscopic hematuria: Secondary | ICD-10-CM | POA: Diagnosis not present

## 2023-10-10 ENCOUNTER — Other Ambulatory Visit: Payer: Self-pay | Admitting: Urology

## 2023-10-10 DIAGNOSIS — R972 Elevated prostate specific antigen [PSA]: Secondary | ICD-10-CM

## 2023-10-24 ENCOUNTER — Ambulatory Visit
Admission: RE | Admit: 2023-10-24 | Discharge: 2023-10-24 | Disposition: A | Discharge status: Home / Self Care | Source: Ambulatory Visit | Attending: Urology | Admitting: Urology

## 2023-10-24 DIAGNOSIS — R972 Elevated prostate specific antigen [PSA]: Secondary | ICD-10-CM

## 2023-10-24 MED ORDER — GADOPICLENOL 0.5 MMOL/ML IV SOLN
10.0000 mL | Freq: Once | INTRAVENOUS | Status: AC | PRN
  Administered 2023-10-24: 10 mL via INTRAVENOUS

## 2023-10-29 DIAGNOSIS — R3121 Asymptomatic microscopic hematuria: Secondary | ICD-10-CM | POA: Diagnosis not present

## 2023-11-05 DIAGNOSIS — R3121 Asymptomatic microscopic hematuria: Secondary | ICD-10-CM | POA: Diagnosis not present

## 2023-11-05 DIAGNOSIS — N200 Calculus of kidney: Secondary | ICD-10-CM | POA: Diagnosis not present

## 2024-01-01 NOTE — Progress Notes (Signed)
 Surgical Instructions   Your procedure is scheduled on DECEMBER 30. 205. Report to [facility] Main Entrance A at 5:30 A.M., then check in with the Admitting office. Any questions or running late day of surgery: call [phone]  Questions prior to your surgery date: call [phone], Monday-Friday, 8am-4pm. If you experience any cold or flu symptoms such as cough, fever, chills, shortness of breath, etc. between now and your scheduled surgery, please notify us  at the above number.     Remember:  Do not eat after midnight the night before your surgery   You may drink clear liquids until 4:30 the morning of your surgery.   Clear liquids allowed are: Water, Non-Citrus Juices (without pulp), Carbonated Beverages, Clear Tea (no milk, honey, etc.), Black Coffee Only (NO MILK, CREAM OR POWDERED CREAMER of any kind), and Gatorade.    Take these medicines the morning of surgery with A SIP OF WATER  oxcarbazepine  (TRILEPTAL )  rosuvastatin  (CRESTOR )   May take these medicines IF NEEDED:    One week prior to surgery, STOP taking any Aspirin (unless otherwise instructed by your surgeon) Aleve, Naproxen, Ibuprofen, Motrin, Advil, Goody's, BC's, all herbal medications, fish oil, and non-prescription vitamins.                     Do NOT Smoke (Tobacco/Vaping) for 24 hours prior to your procedure.  If you use a CPAP at night, you may bring your mask/headgear for your overnight stay.   You will be asked to remove any contacts, glasses, piercing's, hearing aid's, dentures/partials prior to surgery. Please bring cases for these items if needed.    Patients discharged the day of surgery will not be allowed to drive home, and someone needs to stay with them for 24 hours.  SURGICAL WAITING ROOM VISITATION Patients may have no more than 2 support people in the waiting area - these visitors may rotate.   Pre-op nurse will coordinate an appropriate time for 1 ADULT support person, who may not  rotate, to accompany patient in pre-op.  Children under the age of 31 must have an adult with them who is not the patient and must remain in the main waiting area with an adult.  If the patient needs to stay at the hospital during part of their recovery, the visitor guidelines for inpatient rooms apply.  Please refer to the [facility] website for the visitor guidelines for any additional information.   If you received a COVID test during your pre-op visit  it is requested that you wear a mask when out in public, stay away from anyone that may not be feeling well and notify your surgeon if you develop symptoms. If you have been in contact with anyone that has tested positive in the last 10 days please notify you surgeon.      Pre-operative CHG Bathing Instructions   You can play a key role in reducing the risk of infection after surgery. Your skin needs to be as free of germs as possible. You can reduce the number of germs on your skin by washing with CHG (chlorhexidine gluconate) soap before surgery. CHG is an antiseptic soap that kills germs and continues to kill germs even after washing.   DO NOT use if you have an allergy to chlorhexidine/CHG or antibacterial soaps. If your skin becomes reddened or irritated, stop using the CHG and notify one of our RNs at [phone].  TAKE A SHOWER THE NIGHT BEFORE SURGERY   Please keep in mind the following:  DO NOT shave, including legs and underarms, 48 hours prior to surgery.   You may shave your face before/day of surgery.  Place clean sheets on your bed the night before surgery Use a clean washcloth (not used since being washed) for shower. DO NOT sleep with pet's night before surgery.  CHG Shower Instructions:  Wash your face and private area with normal soap. If you choose to wash your hair, wash first with your normal shampoo.  After you use shampoo/soap, rinse your hair and body thoroughly to remove shampoo/soap residue.   Turn the water OFF and apply half the bottle of CHG soap to a CLEAN washcloth.  Apply CHG soap ONLY FROM YOUR NECK DOWN TO YOUR TOES (washing for 3-5 minutes)  DO NOT use CHG soap on face, private areas, open wounds, or sores.  Pay special attention to the area where your surgery is being performed.  If you are having back surgery, having someone wash your back for you may be helpful. Wait 2 minutes after CHG soap is applied, then you may rinse off the CHG soap.  Pat dry with a clean towel  Put on clean pajamas    Additional instructions for the day of surgery: If you choose, you may shower the morning of surgery with an antibacterial soap.  DO NOT APPLY any lotions, deodorants, cologne, or perfumes.   Do not wear jewelry or makeup Do not wear nail polish, gel polish, artificial nails, or any other type of covering on natural nails (fingers and toes) Do not bring valuables to the hospital. [facility] is not responsible for valuables/personal belongings. Put on clean/comfortable clothes.  Please brush your teeth.  Ask your nurse before applying any prescription medications to the skin.

## 2024-01-02 ENCOUNTER — Encounter (HOSPITAL_COMMUNITY)
Admission: RE | Admit: 2024-01-02 | Discharge: 2024-01-02 | Disposition: A | Discharge status: Home / Self Care | Source: Ambulatory Visit | Attending: Surgery | Admitting: Surgery

## 2024-01-02 ENCOUNTER — Other Ambulatory Visit: Payer: Self-pay | Admitting: Surgery

## 2024-01-02 ENCOUNTER — Other Ambulatory Visit: Payer: Self-pay

## 2024-01-02 ENCOUNTER — Encounter (HOSPITAL_COMMUNITY): Payer: Self-pay

## 2024-01-02 VITALS — BP 132/101 | HR 80 | Temp 98.2°F | Resp 19 | Ht 73.0 in | Wt 212.3 lb

## 2024-01-02 DIAGNOSIS — Z85810 Personal history of malignant neoplasm of tongue: Secondary | ICD-10-CM | POA: Diagnosis not present

## 2024-01-02 DIAGNOSIS — Z01812 Encounter for preprocedural laboratory examination: Secondary | ICD-10-CM | POA: Insufficient documentation

## 2024-01-02 DIAGNOSIS — G40909 Epilepsy, unspecified, not intractable, without status epilepticus: Secondary | ICD-10-CM | POA: Diagnosis not present

## 2024-01-02 DIAGNOSIS — K429 Umbilical hernia without obstruction or gangrene: Secondary | ICD-10-CM | POA: Diagnosis not present

## 2024-01-02 DIAGNOSIS — Z9221 Personal history of antineoplastic chemotherapy: Secondary | ICD-10-CM | POA: Diagnosis not present

## 2024-01-02 DIAGNOSIS — G4733 Obstructive sleep apnea (adult) (pediatric): Secondary | ICD-10-CM | POA: Diagnosis not present

## 2024-01-02 DIAGNOSIS — Z01818 Encounter for other preprocedural examination: Secondary | ICD-10-CM

## 2024-01-02 LAB — CBC
HCT: 45.1 % (ref 39.0–52.0)
Hemoglobin: 15.5 g/dL (ref 13.0–17.0)
MCH: 32 pg (ref 26.0–34.0)
MCHC: 34.4 g/dL (ref 30.0–36.0)
MCV: 93 fL (ref 80.0–100.0)
Platelets: 187 K/uL (ref 150–400)
RBC: 4.85 MIL/uL (ref 4.22–5.81)
RDW: 12.9 % (ref 11.5–15.5)
WBC: 9 K/uL (ref 4.0–10.5)
nRBC: 0 % (ref 0.0–0.2)

## 2024-01-02 LAB — BASIC METABOLIC PANEL WITH GFR
Anion gap: 11 (ref 5–15)
BUN: 16 mg/dL (ref 8–23)
CO2: 25 mmol/L (ref 22–32)
Calcium: 9.3 mg/dL (ref 8.9–10.3)
Chloride: 106 mmol/L (ref 98–111)
Creatinine, Ser: 1.12 mg/dL (ref 0.61–1.24)
GFR, Estimated: >60 mL/min
Glucose, Bld: 100 mg/dL — ABNORMAL HIGH (ref 70–99)
Potassium: 4.3 mmol/L (ref 3.5–5.1)
Sodium: 142 mmol/L (ref 135–145)

## 2024-01-02 NOTE — Progress Notes (Signed)
 PCP - Dr. Lynwood Rush Neurologist: Dr. Lawton at [facility] for seizures Cardiologist -   PPM/ICD - denies Device Orders - na Rep Notified - na  Chest x-ray -  EKG -  Stress Test -  ECHO -  Cardiac Cath -   Sleep Study - OSA, uses mouth guard; Z-quiet CPAP -  denies  Non-diabetic  Blood Thinner Instructions: denies Aspirin Instructions:denies  ERAS Protcol - Clears until 0430  Anesthesia review: Yes. Seizures, OSA, hx tongue CA  Patient denies shortness of breath, fever, cough and chest pain at PAT appointment   All instructions explained to the patient, with a verbal understanding of the material. Patient agrees to go over the instructions while at home for a better understanding. Patient also instructed to self quarantine after being tested for COVID-19. The opportunity to ask questions was provided.

## 2024-01-05 NOTE — Anesthesia Preprocedure Evaluation (Addendum)
"                                    Anesthesia Evaluation  Patient identified by MRN, date of birth, ID band Patient awake    Reviewed: Allergy & Precautions, NPO status , Patient's Chart, lab work & pertinent test results  History of Anesthesia Complications Negative for: history of anesthetic complications  Airway Mallampati: IV  TM Distance: >3 FB Neck ROM: Full    Dental no notable dental hx.    Pulmonary asthma , sleep apnea    Pulmonary exam normal        Cardiovascular negative cardio ROS Normal cardiovascular exam     Neuro/Psych Seizures -, Well Controlled,   Anxiety        GI/Hepatic Neg liver ROS,GERD  Medicated and Controlled,,UMBILICAL HERNIA   Endo/Other  negative endocrine ROS    Renal/GU negative Renal ROS     Musculoskeletal negative musculoskeletal ROS (+)    Abdominal   Peds  Hematology negative hematology ROS (+)   Anesthesia Other Findings Hx of tongue base ca s/p resection  Reproductive/Obstetrics                              Anesthesia Physical Anesthesia Plan  ASA: 2  Anesthesia Plan: General   Post-op Pain Management: Tylenol  PO (pre-op)*   Induction: Intravenous  PONV Risk Score and Plan: 2 and Treatment may vary due to age or medical condition, Ondansetron , Dexamethasone  and Midazolam   Airway Management Planned: LMA  Additional Equipment: None  Intra-op Plan:   Post-operative Plan: Extubation in OR  Informed Consent: I have reviewed the patients History and Physical, chart, labs and discussed the procedure including the risks, benefits and alternatives for the proposed anesthesia with the patient or authorized representative who has indicated his/her understanding and acceptance.     Dental advisory given  Plan Discussed with: CRNA  Anesthesia Plan Comments: (PAT note written 01/05/2024 by Allison Zelenak, PA-C.  )         Anesthesia Quick Evaluation  "

## 2024-01-05 NOTE — Progress Notes (Signed)
 Anesthesia Chart Review:  Case: [id number] Date/Time: 01/13/24 0715   Procedure: REPAIR, HERNIA, UMBILICAL, ADULT - OPEN, UMBILICAL HERNIA WITH MESH LMA   Anesthesia type: General   Diagnosis: Umbilical hernia without obstruction or gangrene [K42.9]   Pre-op diagnosis: UMBILICAL HERNIA   [facility]: [facility] / [facility]   Surgeons: Vernetta Berg, MD       DISCUSSION: Patient is a 68 year old male scheduled for the above procedure.   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  As needed oncology follow-up recommended at 02/27/2021 visit with imaging. Last imaging summarized below.   Mild OSA--but moderate to severe when supine. He uses a ZQuiet mouth guard.  He is on Trileptal  for seizure history.  Anesthesia team to evaluate on the day of surgery.   VS: BP (!) 132/101   Pulse 80   Temp 36.8 C   Resp 19   Ht 6' 1 (1.854 m)   Wt 96.3 kg   SpO2 99%   BMI 28.01 kg/m  Care Everywhere: 11/22/2023: BP 156/90 09/11/2023: BP 144/89 07/11/2023: 135/90   PROVIDERS: Norleen Lynwood ORN, MD is PCP  Lawton Handler, MD is neurologist, last visti 07/11/2023. Boyd Hicks, MD is HEM-[facility]). Last visit 02/27/2021 with as needed follow-up.    LABS: Labs reviewed: Acceptable for surgery. (all labs ordered are listed, but only abnormal results are displayed)  Labs Reviewed  BASIC METABOLIC PANEL WITH GFR - Abnormal; Notable for the following components:      Result Value   Glucose, Bld 100 (*)    All other components within normal limits  CBC    OTHER: Home Sleep Study 04/11/2021 [facility]): Impressions  This diagnostic home polysomogram is abnormal due to the presence of:  Mild obstructive sleep apnea with an overall AHI = 11.1; however,  there  was a pattern of distinct periods of moderate-severe OSA which suggests a  positional component (OSA is often worse in supine sleep). Sleep position  was not available on this home sleep study. Overall AHI may also be an  underestimate if patient was usual CPAP at home prior to this study (long  term CPAP use decreases airway resistance and inflammation which will  again worsen when CPAP is discontinued for a few weeks).   These respiratory events were associated with arousals, fragmentation of  sleep architecture and oxygen desaturations to a low of 81%.    Mild periodic limb movement events with a PLM Index of 10.1  Abnormal heart beats as sampled above. Could be PACs but difficult to  characterized on modified lead I EKG.   Recommendations  Consider continuing with CPAP. Could repeat titration if there is concern  that pressure requirements have changed. Other options include mandibular  advancement devices and in select cases surgery...   EEG 11/15/2015 ([facility]): Impression: This EEG was obtained while awake and asleep and is normal.  - Clinical Correlation: Normal EEGs, however, do not rule out  epilepsy.    IMAGES: CT Chest 02/27/2021 [facility]): IMPRESSION: No evidence of metastatic disease within the chest.  For findings above the thoracic inlet please see separately dictated CT of the neck.   CT Neck Soft Tissue 02/27/2021 [facility]): IMPRESSION: -No evidence of recurrent or metastatic disease within the neck.  -Stable postoperative/post treatment changes of left  base of tongue S/P tumor resection.    EKG: Last EKG noted is from 10/26/2015: Minus tachycardia 102 bpm.  Probable left atrial enlargement.  Borderline T abnormalities, inferior leads.   CV: US  Carotid 11/23/2015: Summary:  Findings suggest 1-39% internal carotid artery stenosis  bilaterally. Vertebral arteries are patent with antegrade flow.   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 Lawton at [facility] PAIN, RIGHT 11/03/2006   Sleep apnea 2002   on c-pap   TESTICULAR MASS 11/03/2009   Tongue cancer (HCC) 2018   tongue-base cancer    Past Surgical History:  Procedure Laterality Date   ARTHROSCOPIC REPAIR ACL  2008   right   COLONOSCOPY  2018   polyps   DEEP NECK LYMPH NODE BIOPSY / EXCISION Left 2018   along with tongue base cancel removal   INGUINAL HERNIA REPAIR Right 06/26/2012   Procedure: HERNIA REPAIR INGUINAL ADULT;  Surgeon: Krystal CHRISTELLA Spinner, MD;  Location: [facility];  Service: General;  Laterality: Right;   inguinal herniorrhapy     age 19 and age 48 both rt and lt   INSERTION OF MESH Right 06/26/2012   Procedure: INSERTION OF MESH;  Surgeon: Krystal CHRISTELLA Spinner, MD;  Location: [facility];  Service: General;  Laterality: Right;   POLYPECTOMY  2018   RENAL BIOPSY Left 1979   TONGUE SURGERY  2018   left-tongue base cancel   TONSILLECTOMY  1969   WISDOM TOOTH EXTRACTION  2011    MEDICATIONS:  OVER THE COUNTER MEDICATION   OVER THE COUNTER MEDICATION   Bacillus Coagulans-Inulin (PROBIOTIC-PREBIOTIC PO)   doxycycline  (VIBRA -TABS) 100 MG tablet   famotidine (PEPCID) 20 MG tablet   OVER THE COUNTER MEDICATION   oxcarbazepine  (TRILEPTAL ) 600 MG tablet   predniSONE  (DELTASONE ) 10 MG tablet   rosuvastatin  (CRESTOR ) 20 MG tablet   tamsulosin  (FLOMAX ) 0.4 MG CAPS capsule   VITAMIN D  PO   No current facility-administered medications for this encounter.   He is not currently taking doxycycline  or prednisone   Isaiah Ruder, PA-C Surgical Short  Stay/Anesthesiology [facility] Phone [phone] [facility] Phone [phone] 01/05/2024 1:57 PM

## 2024-01-12 NOTE — H&P (Signed)
 "  REFERRING PHYSICIAN: Norleen Lynwood ORN, MD PROVIDER: VICENTA DASIE POLI, MD MRN: [id number] DOB: [birth year]  Subjective   Chief Complaint: New Consultation ( umb hernia w/o obstru or gang)  History of Present Illness: Bradley Roberts is a 68 y.o. male who is seen as an office consultation for evaluation of New Consultation ( umb hernia w/o obstru or gang)  This gentleman is here for evaluation of umbilical hernia. He reports he has had at least 3 years but is slowly getting larger and causing more discomfort. He feels pressure but no obstructive symptoms. He reports that easily reduces. He has had bilateral inguinal hernia repairs as an infant and has had a left inguinal hernia repair for recurrence about 10 years ago with mesh. He has a previous history of tongue cancer which was treated with chemotherapy. Currently he is otherwise without complaints and is very active  Review of Systems: A complete review of systems was obtained from the patient. I have reviewed this information and discussed as appropriate with the patient. See HPI as well for other ROS.  ROS   Medical History: Past Medical History:  Diagnosis Date  Allergic rhinitis  Anxiety  Epididymo-orchitis  GERD (gastroesophageal reflux disease)  Hyperlipidemia  Seizures (CMS/HHS-HCC)  Sleep apnea  Testicular mass  Tongue cancer (CMS/HHS-HCC)   Patient Active Problem List  Diagnosis  Seizure (CMS/HHS-HCC)  Oropharyngeal carcinoma (CMS/HHS-HCC)   Past Surgical History:  Procedure Laterality Date  INGUINAL HERNIA REPAIR Right 06/26/2012  Insertion of mesh Right 06/26/2012  COLONOSCOPY 2018  Polypectomy 2018  DEEP NECK LYMPH NODE BIOPSY / EXCISION Left 2018  Wisdom tooth extraction  ARTHROSCOPIC REPAIR ACL  inguinal herniorrhapy  RENAL BIOPSY, OPEN  Tongue surgery  TONSILLECTOMY    No Known Allergies  Current Outpatient Medications on File Prior to Visit  Medication Sig Dispense Refill  aspirin 81 MG EC  tablet Take by mouth.  esomeprazole (NEXIUM) 20 MG DR capsule Take by mouth.  rosuvastatin  (CRESTOR ) 20 MG tablet Take by mouth.  tamsulosin  (FLOMAX ) 0.4 mg capsule Take 0.4 mg by mouth once daily  OXcarbazepine  600 mg Tb24 Take 1,200 mg by mouth once daily 180 tablet 3   No current facility-administered medications on file prior to visit.   Family History  Problem Relation Age of Onset  Lung cancer Mother  Liver cancer Mother  Brain cancer Mother  Myocardial Infarction (Heart attack) Father    Social History   Tobacco Use  Smoking Status Never  Smokeless Tobacco Never    Social History   Socioeconomic History  Marital status: Divorced  Tobacco Use  Smoking status: Never  Smokeless tobacco: Never  Vaping Use  Vaping status: Never Used  Substance and Sexual Activity  Drug use: Never   Social Drivers of Health   Food Insecurity: No Food Insecurity (01/22/2022)  Received from [facility]  Hunger Vital Sign  Within the past 12 months, you worried that your food would run out before you got the money to buy more.: Never true  Within the past 12 months, the food you bought just didn't last and you didn't have money to get more.: Never true  Transportation Needs: No Transportation Needs (01/22/2022)  Received from [facility] - [facility]  Lack of Transportation (Medical): No  Lack of Transportation (Non-Medical): No  Housing Stability: Unknown (09/11/2023)  Housing Stability Vital Sign  Homeless in the Last Year: No   Objective:   Vitals:   BP: (!) 144/89  Pulse: 72  Temp: 36.9 C (98.4 F)  TempSrc: Temporal  SpO2: 98%  Weight: 95.6 kg (210 lb 12.8 oz)  Height: 185.4 cm (6' 1)  PainSc: 0-No pain   Body mass index is 27.81 kg/m.  Physical Exam   He appears well on exam  He has an easily reducible, soft, nontender umbilical hernia with a 2 cm fascial defect.  Labs, Imaging and Diagnostic Testing: I have reviewed his notes in the electronic  medical records  Assessment and Plan:   Diagnoses and all orders for this visit:  Umbilical hernia without obstruction and without gangrene   We again discussed abdominal wall anatomy which he is well aware of having a previous hernia surgery. We discussed hernia repair with mesh as his is getting larger and symptomatic. I discussed both laparoscopic and open techniques. He would like to proceed with an open umbilical hernia repair with mesh. I discussed the surgical procedure in detail. We discussed the risks which includes but is not limited to bleeding, infection, use of mesh, hernia recurrence, postoperative recovery, cardiopulmonary issues with anesthesia, etc. Again, he understands and wished to proceed with surgery which will be scheduled  "

## 2024-01-13 ENCOUNTER — Other Ambulatory Visit: Payer: Self-pay

## 2024-01-13 ENCOUNTER — Encounter (HOSPITAL_COMMUNITY): Payer: Self-pay | Admitting: Surgery

## 2024-01-13 ENCOUNTER — Ambulatory Visit (HOSPITAL_COMMUNITY): Payer: Self-pay | Admitting: Vascular Surgery

## 2024-01-13 ENCOUNTER — Ambulatory Visit (HOSPITAL_COMMUNITY)
Admission: RE | Admit: 2024-01-13 | Discharge: 2024-01-13 | Disposition: A | Discharge status: Home / Self Care | Attending: Surgery | Admitting: Surgery

## 2024-01-13 ENCOUNTER — Ambulatory Visit (HOSPITAL_COMMUNITY): Admitting: Anesthesiology

## 2024-01-13 ENCOUNTER — Encounter (HOSPITAL_COMMUNITY)
Admission: RE | Disposition: A | Discharge status: Home / Self Care | Payer: Self-pay | Source: Home / Self Care | Attending: Surgery

## 2024-01-13 DIAGNOSIS — G4730 Sleep apnea, unspecified: Secondary | ICD-10-CM | POA: Diagnosis not present

## 2024-01-13 DIAGNOSIS — K429 Umbilical hernia without obstruction or gangrene: Secondary | ICD-10-CM | POA: Diagnosis not present

## 2024-01-13 DIAGNOSIS — K219 Gastro-esophageal reflux disease without esophagitis: Secondary | ICD-10-CM | POA: Diagnosis not present

## 2024-01-13 DIAGNOSIS — Z9221 Personal history of antineoplastic chemotherapy: Secondary | ICD-10-CM | POA: Diagnosis not present

## 2024-01-13 DIAGNOSIS — J45909 Unspecified asthma, uncomplicated: Secondary | ICD-10-CM | POA: Insufficient documentation

## 2024-01-13 DIAGNOSIS — Z85810 Personal history of malignant neoplasm of tongue: Secondary | ICD-10-CM | POA: Diagnosis not present

## 2024-01-13 DIAGNOSIS — Z79899 Other long term (current) drug therapy: Secondary | ICD-10-CM | POA: Diagnosis not present

## 2024-01-13 DIAGNOSIS — F419 Anxiety disorder, unspecified: Secondary | ICD-10-CM | POA: Insufficient documentation

## 2024-01-13 HISTORY — PX: UMBILICAL HERNIA REPAIR: SHX196

## 2024-01-13 HISTORY — DX: Failed or difficult intubation, initial encounter: T88.4XXA

## 2024-01-13 SURGERY — REPAIR, HERNIA, UMBILICAL, ADULT
Anesthesia: General | Site: Abdomen

## 2024-01-13 MED ORDER — MIDAZOLAM HCL (PF) 2 MG/2ML IJ SOLN
INTRAMUSCULAR | Status: DC | PRN
  Administered 2024-01-13: 2 mg via INTRAVENOUS

## 2024-01-13 MED ORDER — TRAMADOL HCL 50 MG PO TABS: 50.0000 mg | ORAL_TABLET | Freq: Four times a day (QID) | ORAL | 0 refills | Status: AC | PRN

## 2024-01-13 MED ORDER — BUPIVACAINE-EPINEPHRINE 0.25% -1:200000 IJ SOLN
INTRAMUSCULAR | Status: DC | PRN
  Administered 2024-01-13: 20 mL

## 2024-01-13 MED ORDER — LIDOCAINE 2% (20 MG/ML) 5 ML SYRINGE
INTRAMUSCULAR | Status: DC | PRN
  Administered 2024-01-13: 50 mg via INTRAVENOUS

## 2024-01-13 MED ORDER — DEXAMETHASONE SOD PHOSPHATE PF 10 MG/ML IJ SOLN
INTRAMUSCULAR | Status: DC | PRN
  Administered 2024-01-13: 4 mg via INTRAVENOUS

## 2024-01-13 MED ORDER — OXYCODONE HCL 5 MG/5ML PO SOLN: 5.0000 mg | Freq: Once | ORAL | Status: DC | PRN

## 2024-01-13 MED ORDER — ORAL CARE MOUTH RINSE: 15.0000 mL | Freq: Once | OROMUCOSAL | Status: AC

## 2024-01-13 MED ORDER — BUPIVACAINE-EPINEPHRINE (PF) 0.25% -1:200000 IJ SOLN
INTRAMUSCULAR | Status: AC
  Filled 2024-01-13: qty 30

## 2024-01-13 MED ORDER — PHENYLEPHRINE 80 MCG/ML (10ML) SYRINGE FOR IV PUSH (FOR BLOOD PRESSURE SUPPORT)
PREFILLED_SYRINGE | INTRAVENOUS | Status: DC | PRN
  Administered 2024-01-13: 80 ug via INTRAVENOUS
  Administered 2024-01-13 (×2): 160 ug via INTRAVENOUS

## 2024-01-13 MED ORDER — KETOROLAC TROMETHAMINE 30 MG/ML IJ SOLN
INTRAMUSCULAR | Status: DC | PRN
  Administered 2024-01-13: 15 mg via INTRAVENOUS

## 2024-01-13 MED ORDER — ONDANSETRON HCL 4 MG/2ML IJ SOLN
INTRAMUSCULAR | Status: AC
  Filled 2024-01-13: qty 2

## 2024-01-13 MED ORDER — CHLORHEXIDINE GLUCONATE 0.12 % MT SOLN
15.0000 mL | Freq: Once | OROMUCOSAL | Status: AC
  Administered 2024-01-13: 15 mL via OROMUCOSAL
  Filled 2024-01-13: qty 15

## 2024-01-13 MED ORDER — ONDANSETRON HCL 4 MG/2ML IJ SOLN
INTRAMUSCULAR | Status: DC | PRN
  Administered 2024-01-13: 4 mg via INTRAVENOUS

## 2024-01-13 MED ORDER — LACTATED RINGERS IV SOLN: INTRAVENOUS | Status: DC | PRN

## 2024-01-13 MED ORDER — DROPERIDOL 2.5 MG/ML IJ SOLN: 0.6250 mg | Freq: Once | INTRAMUSCULAR | Status: DC | PRN

## 2024-01-13 MED ORDER — PROPOFOL 10 MG/ML IV BOLUS
INTRAVENOUS | Status: AC
  Filled 2024-01-13: qty 20

## 2024-01-13 MED ORDER — CHLORHEXIDINE GLUCONATE CLOTH 2 % EX PADS
6.0000 | MEDICATED_PAD | Freq: Once | CUTANEOUS | Status: AC
  Administered 2024-01-13: 6 via TOPICAL

## 2024-01-13 MED ORDER — OXYCODONE HCL 5 MG PO TABS: 5.0000 mg | ORAL_TABLET | Freq: Once | ORAL | Status: DC | PRN

## 2024-01-13 MED ORDER — FENTANYL CITRATE (PF) 100 MCG/2ML IJ SOLN
INTRAMUSCULAR | Status: AC
  Filled 2024-01-13: qty 2

## 2024-01-13 MED ORDER — LACTATED RINGERS IV SOLN: INTRAVENOUS | Status: DC

## 2024-01-13 MED ORDER — ACETAMINOPHEN 500 MG PO TABS
1000.0000 mg | ORAL_TABLET | Freq: Once | ORAL | Status: AC
  Administered 2024-01-13: 1000 mg via ORAL
  Filled 2024-01-13: qty 2

## 2024-01-13 MED ORDER — PHENYLEPHRINE HCL-NACL 20-0.9 MG/250ML-% IV SOLN
INTRAVENOUS | Status: DC | PRN
  Administered 2024-01-13: 60 ug/min via INTRAVENOUS

## 2024-01-13 MED ORDER — FENTANYL CITRATE (PF) 100 MCG/2ML IJ SOLN
INTRAMUSCULAR | Status: DC | PRN
  Administered 2024-01-13: 100 ug via INTRAVENOUS

## 2024-01-13 MED ORDER — CEFAZOLIN SODIUM-DEXTROSE 2-4 GM/100ML-% IV SOLN
INTRAVENOUS | Status: AC
  Filled 2024-01-13: qty 100

## 2024-01-13 MED ORDER — MIDAZOLAM HCL 2 MG/2ML IJ SOLN
INTRAMUSCULAR | Status: AC
  Filled 2024-01-13: qty 2

## 2024-01-13 MED ORDER — FENTANYL CITRATE (PF) 100 MCG/2ML IJ SOLN: 25.0000 ug | INTRAMUSCULAR | Status: DC | PRN

## 2024-01-13 MED ORDER — LIDOCAINE 2% (20 MG/ML) 5 ML SYRINGE
INTRAMUSCULAR | Status: AC
  Filled 2024-01-13: qty 5

## 2024-01-13 MED ORDER — ENSURE PRE-SURGERY PO LIQD: 296.0000 mL | Freq: Once | ORAL | Status: DC

## 2024-01-13 MED ORDER — KETOROLAC TROMETHAMINE 30 MG/ML IJ SOLN
INTRAMUSCULAR | Status: AC
  Filled 2024-01-13: qty 1

## 2024-01-13 MED ORDER — CEFAZOLIN SODIUM-DEXTROSE 3-4 GM/150ML-% IV SOLN
3.0000 g | INTRAVENOUS | Status: DC
  Filled 2024-01-13: qty 150

## 2024-01-13 MED ORDER — PROPOFOL 10 MG/ML IV BOLUS
INTRAVENOUS | Status: DC | PRN
  Administered 2024-01-13: 160 mg via INTRAVENOUS

## 2024-01-13 MED ORDER — CHLORHEXIDINE GLUCONATE CLOTH 2 % EX PADS: 6.0000 | MEDICATED_PAD | Freq: Once | CUTANEOUS | Status: DC

## 2024-01-13 MED ORDER — ACETAMINOPHEN 500 MG PO TABS: 1000.0000 mg | ORAL_TABLET | ORAL | Status: DC

## 2024-01-13 SURGICAL SUPPLY — 26 items
BAG COUNTER SPONGE SURGICOUNT (BAG) IMPLANT
BLADE CLIPPER SURG (BLADE) IMPLANT
CANISTER SUCTION 3000ML PPV (SUCTIONS) IMPLANT
CHLORAPREP W/TINT 26 (MISCELLANEOUS) ×1 IMPLANT
COVER SURGICAL LIGHT HANDLE (MISCELLANEOUS) ×1 IMPLANT
DERMABOND ADVANCED .7 DNX12 (GAUZE/BANDAGES/DRESSINGS) ×1 IMPLANT
DRAPE LAPAROTOMY 100X72 PEDS (DRAPES) ×1 IMPLANT
ELECTRODE REM PT RTRN 9FT ADLT (ELECTROSURGICAL) ×1 IMPLANT
GLOVE SURG SIGNA 7.5 PF LTX (GLOVE) ×1 IMPLANT
GOWN STRL REUS W/ TWL LRG LVL3 (GOWN DISPOSABLE) ×1 IMPLANT
GOWN STRL REUS W/ TWL XL LVL3 (GOWN DISPOSABLE) ×1 IMPLANT
KIT BASIN OR (CUSTOM PROCEDURE TRAY) ×1 IMPLANT
KIT TURNOVER KIT B (KITS) ×1 IMPLANT
MESH VENTRALEX ST 1-7/10 CRC S (Mesh General) IMPLANT
NEEDLE HYPO 25GX1X1/2 BEV (NEEDLE) ×1 IMPLANT
PACK GENERAL/GYN (CUSTOM PROCEDURE TRAY) ×1 IMPLANT
PAD ARMBOARD POSITIONER FOAM (MISCELLANEOUS) ×1 IMPLANT
PENCIL SMOKE EVACUATOR (MISCELLANEOUS) ×1 IMPLANT
SOLN 0.9% NACL POUR BTL 1000ML (IV SOLUTION) ×1 IMPLANT
SPIKE FLUID TRANSFER (MISCELLANEOUS) ×1 IMPLANT
SUT MNCRL AB 4-0 PS2 18 (SUTURE) ×1 IMPLANT
SUT NOVA NAB DX-16 0-1 5-0 T12 (SUTURE) ×1 IMPLANT
SUT VIC AB 3-0 SH 27X BRD (SUTURE) ×1 IMPLANT
SYR CONTROL 10ML LL (SYRINGE) ×1 IMPLANT
TOWEL GREEN STERILE (TOWEL DISPOSABLE) ×1 IMPLANT
TOWEL GREEN STERILE FF (TOWEL DISPOSABLE) ×1 IMPLANT

## 2024-01-13 NOTE — Anesthesia Procedure Notes (Signed)
 Procedure Name: LMA Insertion Date/Time: 01/13/2024 7:19 AM  Performed by: Lamar Lucie DASEN, CRNAPre-anesthesia Checklist: Patient identified, Emergency Drugs available, Suction available and Patient being monitored Patient Re-evaluated:Patient Re-evaluated prior to induction Oxygen Delivery Method: Circle system utilized Preoxygenation: Pre-oxygenation with 100% oxygen Induction Type: IV induction LMA: LMA inserted LMA Size: 5.0 Number of attempts: 1 Placement Confirmation: positive ETCO2, breath sounds checked- equal and bilateral and CO2 detector Tube secured with: Tape Dental Injury: Teeth and Oropharynx as per pre-operative assessment

## 2024-01-13 NOTE — Transfer of Care (Signed)
 Immediate Anesthesia Transfer of Care Note  Patient: Bradley Roberts  Procedure(s) Performed: REPAIR, HERNIA, UMBILICAL, ADULT WITH MESH (Abdomen)  Patient Location: PACU  Anesthesia Type:General  Level of Consciousness: drowsy and patient cooperative  Airway & Oxygen Therapy: Patient Spontanous Breathing and Patient connected to face mask oxygen  Post-op Assessment: Report given to RN, Post -op Vital signs reviewed and stable, and Patient moving all extremities X 4  Post vital signs: Reviewed and stable  Last Vitals:  Vitals Value Taken Time  BP 102/71 01/13/24 08:11  Temp    Pulse 52 01/13/24 08:14  Resp 20 01/13/24 08:14  SpO2 93 % 01/13/24 08:14  Vitals shown include unfiled device data.  Last Pain:  Vitals:   01/13/24 0640  TempSrc:   PainSc: 0-No pain         Complications: No notable events documented.

## 2024-01-13 NOTE — Op Note (Signed)
" ° °  Alm Bottoms 01/13/2024   Pre-op Diagnosis: UMBILICAL HERNIA     Post-op Diagnosis: UMBILICAL HERNIA (1.5 CM FASCIAL DEFECT)  Procedures: OPEN UMBILICAL HERNIA REPAIR WITH MESH  Surgeon(s): Vernetta Berg, MD  Anesthesia: General  Staff:  Circulator: Welford Stevenson NOVAK, RN Scrub Person: Shona, Amy T, RN  Estimated Blood Loss: Minimal               Findings: The patient was found to have a 1.5 cm fascial defect at the umbilicus.  The hernia was repaired with a 4.3 cm round ventral Prolene patch from Bard  Procedure: The patient was brought to the op room and identified the correct patient.  He was placed upon the operating table and general anesthesia was induced.  His abdomen was prepped and draped in the usual sterile fashion.  I anesthetized the skin at the upper edge of the umbilicus with Marcaine  and then made a semicircular incision with a scalpel.  I carried this down to the hernia sac which was easily identified and separated from the overlying umbilical skin with the cautery.  I then opened the sac and found to contain only preperitoneal fat.  I excised the sac down the fascia circumferentially with the cautery.  The fascial defect measured only 1.5 cm in size.  About a 4.3 cm round ventral Prolene packed from bard onto the field.  I placed the mesh through the fascial opening and then pulled it up against the peritoneum with the ties.  I then sutured the mesh in place circumferentially with #1 Novafil sutures.  The mesh appeared to lay appropriately around the fascia and peritoneum.  I then cut the ties and then closed the fascia over the top of the mesh with figure-of-eight #1 Novafil sutures.  Hemostasis appeared to be achieved.  I anesthetized the fascia further Marcaine .  I then closed the subcutaneous tissue with interrupted 3-0 Vicryl sutures and closed the skin with a running 4-0 Monocryl.  Dermabond was then applied.  The patient tolerated the procedure well.  All the  counts were correct at the end of the procedure.  The patient was then extubated in the operating room and taken in a stable condition to the recovery room.          Berg Vernetta   Date: 01/13/2024  Time: 8:03 AM    "

## 2024-01-13 NOTE — Discharge Instructions (Signed)
[facility] _______[facility]  UMBILICAL OR INGUINAL HERNIA REPAIR: POST OP INSTRUCTIONS  Always review your discharge instruction sheet given to you by the facility where your surgery was performed. IF YOU HAVE DISABILITY OR FAMILY LEAVE FORMS, YOU MUST BRING THEM TO THE OFFICE FOR PROCESSING.   DO NOT GIVE THEM TO YOUR DOCTOR.  1. A  prescription for pain medication may be given to you upon discharge.  Take your pain medication as prescribed, if needed.  If narcotic pain medicine is not needed, then you may take acetaminophen (Tylenol) or ibuprofen (Advil) as needed. 2. Take your usually prescribed medications unless otherwise directed. If you need a refill on your pain medication, please contact your pharmacy.  They will contact our office to request authorization. Prescriptions will not be filled after 5 pm or on week-ends. 3. You should follow a light diet the first 24 hours after arrival home, such as soup and crackers, etc.  Be sure to include lots of fluids daily.  Resume your normal diet the day after surgery. 4.Most patients will experience some swelling and bruising around the umbilicus or in the groin and scrotum.  Ice packs and reclining will help.  Swelling and bruising can take several days to resolve.  6. It is common to experience some constipation if taking pain medication after surgery.  Increasing fluid intake and taking a stool softener (such as Colace) will usually help or prevent this problem from occurring.  A mild laxative (Milk of Magnesia or Miralax) should be taken according to package directions if there are no bowel movements after 48 hours. 7. Unless discharge instructions indicate otherwise, you may remove your bandages 24-48 hours after surgery, and you may shower at that time.  You may have steri-strips (small skin tapes) in place directly over the incision.  These strips should be left on the skin for 7-10 days.  If your surgeon used skin glue on the  incision, you may shower in 24 hours.  The glue will flake off over the next 2-3 weeks.  Any sutures or staples will be removed at the office during your follow-up visit. 8. ACTIVITIES:  You may resume regular (light) daily activities beginning the next day--such as daily self-care, walking, climbing stairs--gradually increasing activities as tolerated.  You may have sexual intercourse when it is comfortable.  Refrain from any heavy lifting or straining until approved by your doctor.  a.You may drive when you are no longer taking prescription pain medication, you can comfortably wear a seatbelt, and you can safely maneuver your car and apply brakes. b.RETURN TO WORK:   _____________________________________________  9.You should see your doctor in the office for a follow-up appointment approximately 2-3 weeks after your surgery.  Make sure that you call for this appointment within a day or two after you arrive home to insure a convenient appointment time. 10.OTHER INSTRUCTIONS: _YOU MAY SHOWER STARTING TOMORROW ICE PACK, TYLENOL, AND IBUPROFEN ALSO FOR PAIN NO LIFTING MORE THAN 15 POUNDS FOR 4 WEEKS________________________    _____________________________________  WHEN TO CALL YOUR DOCTOR: Fever over 101.0 Inability to urinate Nausea and/or vomiting Extreme swelling or bruising Continued bleeding from incision. Increased pain, redness, or drainage from the incision  The clinic staff is available to answer your questions during regular business hours.  Please don't hesitate to call and ask to speak to one of the nurses for clinical concerns.  If you have a medical emergency, go to the nearest emergency room or call 911.  A surgeon from [facility] is always on call at the hospital   [address], [address], [place], [place]  [address] ?  [address] [address], [place], [place]   [address] [phone] ? [phone] ? FAX [phone] Web site: [email or web address] 

## 2024-01-13 NOTE — Anesthesia Postprocedure Evaluation (Signed)
"   Anesthesia Post Note  Patient: Bradley Roberts  Procedure(s) Performed: REPAIR, HERNIA, UMBILICAL, ADULT WITH MESH (Abdomen)     Patient location during evaluation: PACU Anesthesia Type: General Level of consciousness: awake and alert Pain management: pain level controlled Vital Signs Assessment: post-procedure vital signs reviewed and stable Respiratory status: spontaneous breathing, nonlabored ventilation and respiratory function stable Cardiovascular status: blood pressure returned to baseline Postop Assessment: no apparent nausea or vomiting Anesthetic complications: no   No notable events documented.  Last Vitals:  Vitals:   01/13/24 0900 01/13/24 0915  BP: 114/74 125/82  Pulse: (!) 51 (!) 52  Resp: 10 18  Temp:  36.4 C  SpO2: 95% 99%    Last Pain:  Vitals:   01/13/24 0900  TempSrc:   PainSc: Asleep                 Vertell Row      "

## 2024-01-13 NOTE — Interval H&P Note (Signed)
 History and Physical Interval Note:no change in H and P  01/13/2024 7:04 AM  Bradley Roberts  has presented today for surgery, with the diagnosis of UMBILICAL HERNIA.  The various methods of treatment have been discussed with the patient and family. After consideration of risks, benefits and other options for treatment, the patient has consented to  Procedures with comments: REPAIR, HERNIA, UMBILICAL, ADULT (N/A) - OPEN, UMBILICAL HERNIA WITH MESH LMA as a surgical intervention.  The patient's history has been reviewed, patient examined, no change in status, stable for surgery.  I have reviewed the patient's chart and labs.  Questions were answered to the patient's satisfaction.     Vicenta Poli

## 2024-01-14 ENCOUNTER — Encounter (HOSPITAL_COMMUNITY): Payer: Self-pay | Admitting: Surgery

## 2024-07-26 ENCOUNTER — Encounter: Admitting: Internal Medicine
